# Patient Record
Sex: Female | Born: 1985
Health system: Southern US, Community
[De-identification: ages and names within clinical notes are randomized; demographics above are authoritative.]

## PROBLEM LIST (undated history)

## (undated) ENCOUNTER — Inpatient Hospital Stay (HOSPITAL_COMMUNITY): Payer: Self-pay

## (undated) DIAGNOSIS — O139 Gestational [pregnancy-induced] hypertension without significant proteinuria, unspecified trimester: Secondary | ICD-10-CM

## (undated) DIAGNOSIS — N83209 Unspecified ovarian cyst, unspecified side: Secondary | ICD-10-CM

## (undated) HISTORY — PX: WISDOM TOOTH EXTRACTION: SHX21

## (undated) HISTORY — PX: NO PAST SURGERIES: SHX2092

## (undated) HISTORY — PX: DILATION AND CURETTAGE OF UTERUS: SHX78

---

## 2009-10-31 ENCOUNTER — Emergency Department: Payer: Self-pay | Admitting: Internal Medicine

## 2009-12-23 ENCOUNTER — Encounter: Payer: Self-pay | Admitting: Obstetrics and Gynecology

## 2010-01-10 ENCOUNTER — Encounter: Payer: Self-pay | Admitting: Maternal & Fetal Medicine

## 2010-02-22 ENCOUNTER — Observation Stay: Payer: Self-pay | Admitting: Obstetrics and Gynecology

## 2010-02-23 ENCOUNTER — Ambulatory Visit: Payer: Self-pay | Admitting: Obstetrics and Gynecology

## 2012-06-19 NOTE — L&D Delivery Note (Signed)
  Delivery Note  First Stage: Labor onset: 1635 Augmentation : none Analgesia /Anesthesia intrapartum: water immersion AROM at 1635  Second Stage: Complete dilation at 1930 Onset of pushing at 1930 FHR second stage 130  Delivery of a viable female at 58 by CNM in LOA position        delivery of fetal head between ctx then maternal relaxation without any urge to push        delay in shoulder delivery by a minute due to loss of maternal urge to push        required staff assisted maternal position change from hands-knees to supine with CNM directed pushing for delivery       delivery of shoulder without difficulty  no nuchal cord Cord double clamped after cessation of pulsation, cut by FOB  Baby to Dad for bonding - Mom assisted out of tub to bed Cord blood sample collected   Collection for public cord blood donation done   Third Stage: Placenta delivered shultz intact with 3 VC @ 2002 Placenta disposition: for disposal Uterine tone slightly boggy- massaged to firm - bleeding moderate amount  1st degree laceration identified - old repair site with skin tear only Anesthesia for repair: 1% xylocaine local Repair 4-0 subcuticular Est. Blood Loss (mL): 300  Complications: none  Mom to postpartum.  Baby to Mom for bonding and initial BF.  Newborn: Birth Weight: 9 pounds 12 ounces  Apgar Scores: 7-9 Feeding planned: breast  Marlinda Mike CNM, MSN, FACNM 11/16/2012, 8:41 PM

## 2012-06-24 LAB — OB RESULTS CONSOLE ANTIBODY SCREEN: Antibody Screen: NEGATIVE

## 2012-06-24 LAB — OB RESULTS CONSOLE RUBELLA ANTIBODY, IGM: Rubella: IMMUNE

## 2012-06-24 LAB — OB RESULTS CONSOLE RPR: RPR: NONREACTIVE

## 2012-06-24 LAB — OB RESULTS CONSOLE GC/CHLAMYDIA
Gonorrhea: NEGATIVE
Gonorrhea: NEGATIVE

## 2012-10-17 LAB — OB RESULTS CONSOLE GBS: GBS: POSITIVE

## 2012-11-15 ENCOUNTER — Other Ambulatory Visit: Payer: Self-pay | Admitting: Certified Nurse Midwife

## 2012-11-16 ENCOUNTER — Encounter (HOSPITAL_COMMUNITY): Payer: Self-pay | Admitting: *Deleted

## 2012-11-16 ENCOUNTER — Inpatient Hospital Stay (HOSPITAL_COMMUNITY)
Admission: AD | Admit: 2012-11-16 | Discharge: 2012-11-18 | DRG: 372 | Disposition: A | Discharge status: Home / Self Care | Payer: BC Managed Care – PPO | Source: Ambulatory Visit | Attending: Obstetrics and Gynecology | Admitting: Obstetrics and Gynecology

## 2012-11-16 DIAGNOSIS — O139 Gestational [pregnancy-induced] hypertension without significant proteinuria, unspecified trimester: Secondary | ICD-10-CM | POA: Diagnosis present

## 2012-11-16 DIAGNOSIS — Z22330 Carrier of Group B streptococcus: Secondary | ICD-10-CM

## 2012-11-16 DIAGNOSIS — O480 Post-term pregnancy: Principal | ICD-10-CM | POA: Diagnosis not present

## 2012-11-16 DIAGNOSIS — O99892 Other specified diseases and conditions complicating childbirth: Secondary | ICD-10-CM | POA: Diagnosis present

## 2012-11-16 HISTORY — DX: Gestational (pregnancy-induced) hypertension without significant proteinuria, unspecified trimester: O13.9

## 2012-11-16 LAB — COMPREHENSIVE METABOLIC PANEL
ALT: 12 U/L (ref 0–35)
AST: 16 U/L (ref 0–37)
Albumin: 2.6 g/dL — ABNORMAL LOW (ref 3.5–5.2)
Alkaline Phosphatase: 254 U/L — ABNORMAL HIGH (ref 39–117)
BUN: 7 mg/dL (ref 6–23)
CO2: 21 mEq/L (ref 19–32)
Calcium: 9.6 mg/dL (ref 8.4–10.5)
Chloride: 104 mEq/L (ref 96–112)
Creatinine, Ser: 0.52 mg/dL (ref 0.50–1.10)
GFR calc Af Amer: >90 mL/min (ref >90)
GFR calc non Af Amer: >90 mL/min (ref >90)
Glucose, Bld: 95 mg/dL (ref 70–99)
Potassium: 4.1 mEq/L (ref 3.5–5.1)
Sodium: 137 mEq/L (ref 135–145)
Total Bilirubin: 0.2 mg/dL — ABNORMAL LOW (ref 0.3–1.2)
Total Protein: 6 g/dL (ref 6.0–8.3)

## 2012-11-16 LAB — CBC
HCT: 34 % — ABNORMAL LOW (ref 36.0–46.0)
Hemoglobin: 11.5 g/dL — ABNORMAL LOW (ref 12.0–15.0)
MCH: 30.1 pg (ref 26.0–34.0)
MCHC: 33.8 g/dL (ref 30.0–36.0)
MCV: 89 fL (ref 78.0–100.0)
Platelets: 229 10*3/uL (ref 150–400)
RBC: 3.82 MIL/uL — ABNORMAL LOW (ref 3.87–5.11)
RDW: 15 % (ref 11.5–15.5)
WBC: 9.4 10*3/uL (ref 4.0–10.5)

## 2012-11-16 LAB — RPR: RPR Ser Ql: NONREACTIVE

## 2012-11-16 LAB — URIC ACID: Uric Acid, Serum: 4.4 mg/dL (ref 2.4–7.0)

## 2012-11-16 LAB — LACTATE DEHYDROGENASE: LDH: 168 U/L (ref 94–250)

## 2012-11-16 MED ORDER — OXYCODONE-ACETAMINOPHEN 5-325 MG PO TABS
1.0000 | ORAL_TABLET | ORAL | Status: DC | PRN
  Administered 2012-11-17 – 2012-11-18 (×5): 1 via ORAL
  Filled 2012-11-16 (×5): qty 1

## 2012-11-16 MED ORDER — WITCH HAZEL-GLYCERIN EX PADS: 1.0000 "application " | MEDICATED_PAD | CUTANEOUS | Status: DC | PRN

## 2012-11-16 MED ORDER — OXYTOCIN BOLUS FROM INFUSION: 500.0000 mL | INTRAVENOUS | Status: DC

## 2012-11-16 MED ORDER — SENNOSIDES-DOCUSATE SODIUM 8.6-50 MG PO TABS
2.0000 | ORAL_TABLET | Freq: Every day | ORAL | Status: DC
  Administered 2012-11-16 – 2012-11-17 (×2): 2 via ORAL

## 2012-11-16 MED ORDER — IBUPROFEN 600 MG PO TABS: 600.0000 mg | ORAL_TABLET | Freq: Four times a day (QID) | ORAL | Status: DC | PRN

## 2012-11-16 MED ORDER — ACETAMINOPHEN 325 MG PO TABS: 650.0000 mg | ORAL_TABLET | ORAL | Status: DC | PRN

## 2012-11-16 MED ORDER — OXYTOCIN 40 UNITS IN LACTATED RINGERS INFUSION - SIMPLE MED
62.5000 mL/h | INTRAVENOUS | Status: DC
  Administered 2012-11-16: 62.5 mL/h via INTRAVENOUS
  Filled 2012-11-16: qty 1000

## 2012-11-16 MED ORDER — BENZOCAINE-MENTHOL 20-0.5 % EX AERO
1.0000 "application " | INHALATION_SPRAY | CUTANEOUS | Status: DC | PRN
  Administered 2012-11-17: 1 via TOPICAL
  Filled 2012-11-16: qty 56

## 2012-11-16 MED ORDER — DIPHENHYDRAMINE HCL 25 MG PO CAPS: 25.0000 mg | ORAL_CAPSULE | Freq: Four times a day (QID) | ORAL | Status: DC | PRN

## 2012-11-16 MED ORDER — SODIUM CHLORIDE 0.9 % IJ SOLN: 3.0000 mL | INTRAMUSCULAR | Status: DC | PRN

## 2012-11-16 MED ORDER — SIMETHICONE 80 MG PO CHEW: 80.0000 mg | CHEWABLE_TABLET | ORAL | Status: DC | PRN

## 2012-11-16 MED ORDER — DIBUCAINE 1 % RE OINT: 1.0000 "application " | TOPICAL_OINTMENT | RECTAL | Status: DC | PRN

## 2012-11-16 MED ORDER — LIDOCAINE HCL (PF) 1 % IJ SOLN
30.0000 mL | INTRAMUSCULAR | Status: DC | PRN
  Administered 2012-11-16: 30 mL via SUBCUTANEOUS
  Filled 2012-11-16 (×2): qty 30

## 2012-11-16 MED ORDER — IBUPROFEN 600 MG PO TABS
600.0000 mg | ORAL_TABLET | Freq: Four times a day (QID) | ORAL | Status: DC
  Administered 2012-11-16 – 2012-11-18 (×6): 600 mg via ORAL
  Filled 2012-11-16 (×6): qty 1

## 2012-11-16 MED ORDER — SODIUM CHLORIDE 0.9 % IV SOLN: 250.0000 mL | INTRAVENOUS | Status: DC | PRN

## 2012-11-16 MED ORDER — PENICILLIN G POTASSIUM 5000000 UNITS IJ SOLR
5.0000 10*6.[IU] | Freq: Once | INTRAVENOUS | Status: AC
  Administered 2012-11-16: 5 10*6.[IU] via INTRAVENOUS
  Filled 2012-11-16: qty 5

## 2012-11-16 MED ORDER — LANOLIN HYDROUS EX OINT: TOPICAL_OINTMENT | CUTANEOUS | Status: DC | PRN

## 2012-11-16 MED ORDER — DEXTROSE 5 % IV SOLN
2.5000 10*6.[IU] | INTRAVENOUS | Status: DC
  Filled 2012-11-16 (×3): qty 2.5

## 2012-11-16 MED ORDER — CITRIC ACID-SODIUM CITRATE 334-500 MG/5ML PO SOLN: 30.0000 mL | ORAL | Status: DC | PRN

## 2012-11-16 MED ORDER — LACTATED RINGERS IV SOLN
500.0000 mL | INTRAVENOUS | Status: DC | PRN
  Administered 2012-11-16: 1000 mL via INTRAVENOUS

## 2012-11-16 MED ORDER — SODIUM CHLORIDE 0.9 % IJ SOLN: 3.0000 mL | Freq: Two times a day (BID) | INTRAMUSCULAR | Status: DC

## 2012-11-16 NOTE — Progress Notes (Signed)
S:  Laboring in water x 1 hour      out of tub to void and refresh water  O:  VS: Blood pressure 154/86, pulse 85, temperature 98 F (36.7 C), temperature source Oral, resp. rate 18, height 5\' 8"  (1.727 m), weight 84.823 kg (187 lb).        FHR : intermittent FHR - baseline 150s - no audible decels thru ctx and after        Toco: contractions every 2-4 minutes / strong        Cervix : 9 / 90% / vtx +1 / large amount show        Membranes: clear fluid  A: active labor Water immersion effective for pain control  P: re-enter water for second stage      Monitor FHR every 15 minutes now - then every 5 minutes with active second stage     Marlinda Mike CNM, MSN, [facility] 11/16/2012, 7:18 PM

## 2012-11-16 NOTE — MAU Note (Signed)
IV disconnected for transfer. Site without redness or swelling.

## 2012-11-16 NOTE — Progress Notes (Signed)
S:  Aware of ctx - not painful  O:  VS: Blood pressure 154/86, pulse 85, temperature 98 F (36.7 C), temperature source Oral, resp. rate 18, weight 84.823 kg (187 lb).        FHR : baseline 140 / variability moderate / accelerations + / no decelerations        Toco: contractions every 5-7 minutes / mild with UI between ctx         Cervix : 4-5 / 80% / vtx / -1 / ROT / BBOW        PCN prophylaxis initial dose given in MAU at time of arrival to MAU        Membranes: AROM - clear fluid  A: Induction of labor     FHR category 1  P: ambulate x 1-2 hours / water immersion prn      If no active labor by 1900-  Will start pitocin    Marlinda Mike CNM, MSN, [facility] 11/16/2012, 4:43 PM

## 2012-11-16 NOTE — MAU Note (Signed)
Pt for induction, +GBS, unable to go to L&D at this time.  Irregular ctx's.  Bloody mucous since examined.

## 2012-11-16 NOTE — Plan of Care (Signed)
Problem: Consults Goal: Birthing Suites Patient Information Press F2 to bring up selections list Outcome: Completed/Met Date Met:  11/16/12  Pt > [redacted] weeks EGA

## 2012-11-16 NOTE — H&P (Signed)
  OB ADMISSION/ HISTORY & PHYSICAL:  Admission Date: 11/16/2012 12:20 PM  Admit Diagnosis: postdates / grade 3 placenta  Latasha Lynch is a 27 y.o. female presenting for induction of labor.  Prenatal History: [id number]   EDC : Not found.  Prenatal care at [facility]  Primary Ob Provider: Ernestina Penna Prenatal course complicated by hx preeclampsia  / preterm twin delivery in past pregnancy / gest HTN /positive GBS / postdates pregnancy / grade 3 placenta with (? Complex amniotic fluid?)    Normal fetal testing - reactive NST / normal AFI and BPP / no EFW  (by exam ~ 8 pounds)  Prenatal Labs: ABO, Rh: A (01/06 0000) positive Antibody: Negative (01/06 0000) Rubella: Immune (01/06 0000)  RPR: Nonreactive (01/06 0000)  HBsAg: Negative (01/06 0000)  HIV: Non-reactive (01/06 0000)  GBS: Positive (05/01 0000)  1 hr Glucola : nl  Medical / Surgical History :  Past medical history:  Past Medical History  Diagnosis Date  . Pregnancy induced hypertension   . Preterm labor      Past surgical history:  Past Surgical History  Procedure Laterality Date  . No past surgeries      Family History:  Family History  Problem Relation Age of Onset  . Hypertension Mother   . Cancer Mother     mouth  . Hypertension Father   . Cancer Maternal Grandmother     ovarian  . Cancer Paternal Grandmother     lung  . Cancer Paternal Grandfather     prostate     Social History:  reports that she has never smoked. She has never used smokeless tobacco. She reports that she does not drink alcohol or use illicit drugs.  Allergies: Review of patient's allergies indicates not on file.   Current Medications at time of admission:  Prenatal daily EPO 1000mg  BID ( oral and vaginal) Blue cohosh BID Tums prn  Review of Systems: Irregular ctx x 24 hours Active FM No LOF or bleeding  Physical Exam:  VS: Blood pressure 140/96, pulse 101, temperature 98 F (36.7 C), temperature  source Oral, resp. rate 18.  General: alert and oriented, appears calm and comfortable Heart: RRR Lungs: Clear lung fields Abdomen: Gravid, soft and non-tender, non-distended / uterus: gravid / non-tender / fetal lie: ROT Extremities: 1+ edema  Genitalia / VE:  4 / 80% / vtx / -1 / BBOW  FHR: baseline rate 140 / variability moderate / accelerations + / no decelerations TOCO: mild irregular ctx  Assessment: [redacted] weeks gestation Gestational hypertension Favorable Bishops score  FHR category 1   Plan:  Admit Check PIH labs PCN protocol AROM 2 hours after PCN - expectant management with water immersion in labor if clear AF Pitocin augmentation if no active labor in next 2 hours Dr Billy Coast notified of admission / plan of care   Marlinda Mike CNM, MSN, [facility] 11/16/2012, 1:16 PM

## 2012-11-16 NOTE — MAU Note (Signed)
Called pharmacy to have them send penicillin here

## 2012-11-17 LAB — CBC
HCT: 32.4 % — ABNORMAL LOW (ref 36.0–46.0)
Hemoglobin: 10.7 g/dL — ABNORMAL LOW (ref 12.0–15.0)
MCH: 29.4 pg (ref 26.0–34.0)
MCHC: 33 g/dL (ref 30.0–36.0)
MCV: 89 fL (ref 78.0–100.0)
Platelets: 237 10*3/uL (ref 150–400)
RBC: 3.64 MIL/uL — ABNORMAL LOW (ref 3.87–5.11)
RDW: 14.9 % (ref 11.5–15.5)
WBC: 17.2 10*3/uL — ABNORMAL HIGH (ref 4.0–10.5)

## 2012-11-17 NOTE — Progress Notes (Signed)
PPD 1 SVD  S:  Reports feeling well except "ctx with BF and a little tired" / no PIH symptoms             Tolerating po/ No nausea or vomiting             Bleeding is lighter this AM             Pain controlled with motrin and percocet             Up ad lib / ambulatory / voiding QS  Newborn breast feeding  / Circumcision planned today O:               VS: BP 131/83  Pulse 98  Temp(Src) 98.2 F (36.8 C) (Oral)  Resp 20  Ht 5\' 8"  (1.727 m)  Wt 84.823 kg (187 lb)  BMI 28.44 kg/m2   LABS:  Recent Labs  11/16/12 1354 11/17/12 0615  WBC 9.4 17.2*  HGB 11.5* 10.7*  PLT 229 237                                     Physical Exam:             Alert and oriented X3  Lungs: Clear and unlabored  Heart: regular rate and rhythm / no mumurs  Abdomen: soft, non-tender, non-distended              Fundus: firm, non-tender, U-1  Perineum: mild edema  Lochia: light  Extremities: 1+ edema, no calf pain or tenderness   A: PPD # 1              gestational hypertension - delivered  doing well - stable status  P:  Routine post partum orders  Monitor BP - consider antihypertensive if diastolic over 100  Marlinda Mike CNM, MSN, FACNM 11/17/2012, 9:55 AM

## 2012-11-18 MED ORDER — IBUPROFEN 600 MG PO TABS: 600.0000 mg | ORAL_TABLET | Freq: Four times a day (QID) | ORAL | Status: DC

## 2012-11-18 MED ORDER — OXYCODONE-ACETAMINOPHEN 5-325 MG PO TABS: 1.0000 | ORAL_TABLET | ORAL | Status: DC | PRN

## 2012-11-18 NOTE — Discharge Summary (Signed)
Obstetric Discharge Summary  Reason for Admission: induction of labor Prenatal Procedures: NST and ultrasound Intrapartum Procedures: spontaneous vaginal delivery and GBS prophylaxis Postpartum Procedures: none Complications-Operative and Postpartum: 1st degree perineal laceration Hemoglobin  Date Value Range Status  11/17/2012 10.7* 12.0 - 15.0 g/dL Final     HCT  Date Value Range Status  11/17/2012 32.4* 36.0 - 46.0 % Final    Physical Exam:  General: alert, cooperative and no distress Lochia: appropriate Uterine Fundus: firm Incision: healing well DVT Evaluation: No evidence of DVT seen on physical exam.  Discharge Diagnoses: Term Pregnancy-delivered  Discharge Information: Date: 11/18/2012 Activity: pelvic rest Diet: routine Medications: PNV, Ibuprofen, Colace and Percocet Condition: stable Instructions: refer to practice specific booklet Discharge to: home Follow-up Information   Follow up with [facility] A., MD. Schedule an appointment as soon as possible for a visit in 6 weeks.   Contact information:   [address] [place] [address] [phone]       Newborn Data: Live born female  Birth Weight: 9 lb 12.6 oz (4440 g) APGAR: 7, 9  Home with mother.  Marlinda Mike 11/18/2012, 10:13 AM

## 2012-11-18 NOTE — Progress Notes (Signed)
PPD 2 SVD  S:  Reports feeling well             Tolerating po/ No nausea or vomiting             Bleeding is light             Pain controlled with motrin and percocet             Up ad lib / ambulatory / voiding QS  Newborn breast feeding    O:               VS: BP 137/84  Pulse 81  Temp(Src) 97.9 F (36.6 C) (Oral)  Resp 18  Ht 5\' 8"  (1.727 m)  Wt 84.823 kg (187 lb)  BMI 28.44 kg/m2   LABS:  Recent Labs  11/16/12 1354 11/17/12 0615  WBC 9.4 17.2*  HGB 11.5* 10.7*  PLT 229 237                                  Physical Exam:             Alert and oriented X3  Lungs: Clear and unlabored  Heart: regular rate and rhythm / no mumurs  Abdomen: soft, non-tender, non-distended              Fundus: firm, non-tender, U-1  Perineum: no edema  Lochia: light  Extremities: no edema, no calf pain or tenderness    A: PPD # 2              Gestational hypertension - stable  Doing well - stable status  P:  Routine post partum orders  DC home  Marlinda Mike CNM, MSN, [facility] 11/18/2012, 10:11 AM

## 2014-04-20 ENCOUNTER — Encounter (HOSPITAL_COMMUNITY): Payer: Self-pay | Admitting: *Deleted

## 2015-11-28 ENCOUNTER — Emergency Department: Payer: BLUE CROSS/BLUE SHIELD

## 2015-11-28 ENCOUNTER — Emergency Department
Admission: EM | Admit: 2015-11-28 | Discharge: 2015-11-28 | Disposition: A | Discharge status: Home / Self Care | Payer: BLUE CROSS/BLUE SHIELD | Attending: Emergency Medicine | Admitting: Emergency Medicine

## 2015-11-28 ENCOUNTER — Encounter: Payer: Self-pay | Admitting: Emergency Medicine

## 2015-11-28 DIAGNOSIS — N83201 Unspecified ovarian cyst, right side: Secondary | ICD-10-CM

## 2015-11-28 DIAGNOSIS — R1031 Right lower quadrant pain: Secondary | ICD-10-CM | POA: Diagnosis present

## 2015-11-28 LAB — COMPREHENSIVE METABOLIC PANEL
ALBUMIN: 5.2 g/dL — AB (ref 3.5–5.0)
ALK PHOS: 54 U/L (ref 38–126)
ALT: 15 U/L (ref 14–54)
AST: 18 U/L (ref 15–41)
Anion gap: 10 (ref 5–15)
BILIRUBIN TOTAL: 1.4 mg/dL — AB (ref 0.3–1.2)
BUN: 16 mg/dL (ref 6–20)
CO2: 19 mmol/L — ABNORMAL LOW (ref 22–32)
CREATININE: 0.83 mg/dL (ref 0.44–1.00)
Calcium: 9.6 mg/dL (ref 8.9–10.3)
Chloride: 108 mmol/L (ref 101–111)
GFR calc Af Amer: >60 mL/min (ref >60)
GLUCOSE: 103 mg/dL — AB (ref 65–99)
Potassium: 3.7 mmol/L (ref 3.5–5.1)
Sodium: 137 mmol/L (ref 135–145)
TOTAL PROTEIN: 7.8 g/dL (ref 6.5–8.1)

## 2015-11-28 LAB — URINALYSIS COMPLETE WITH MICROSCOPIC (ARMC ONLY)
BILIRUBIN URINE: NEGATIVE
GLUCOSE, UA: NEGATIVE mg/dL
HGB URINE DIPSTICK: NEGATIVE
LEUKOCYTES UA: NEGATIVE
NITRITE: NEGATIVE
Protein, ur: 100 mg/dL — AB
SPECIFIC GRAVITY, URINE: 1.029 (ref 1.005–1.030)
pH: 5 (ref 5.0–8.0)

## 2015-11-28 LAB — PREGNANCY, URINE: PREG TEST UR: NEGATIVE

## 2015-11-28 LAB — WET PREP, GENITAL
Clue Cells Wet Prep HPF POC: NONE SEEN
Sperm: NONE SEEN
TRICH WET PREP: NONE SEEN
YEAST WET PREP: NONE SEEN

## 2015-11-28 LAB — CHLAMYDIA/NGC RT PCR (ARMC ONLY)
Chlamydia Tr: NOT DETECTED
N GONORRHOEAE: NOT DETECTED

## 2015-11-28 LAB — CBC
HEMATOCRIT: 41.2 % (ref 35.0–47.0)
Hemoglobin: 14.2 g/dL (ref 12.0–16.0)
MCH: 31 pg (ref 26.0–34.0)
MCHC: 34.4 g/dL (ref 32.0–36.0)
MCV: 90.1 fL (ref 80.0–100.0)
PLATELETS: 219 10*3/uL (ref 150–440)
RBC: 4.58 MIL/uL (ref 3.80–5.20)
RDW: 12.7 % (ref 11.5–14.5)
WBC: 24 10*3/uL — AB (ref 3.6–11.0)

## 2015-11-28 LAB — LIPASE, BLOOD: Lipase: 30 U/L (ref 11–51)

## 2015-11-28 MED ORDER — IOPAMIDOL (ISOVUE-300) INJECTION 61%
100.0000 mL | Freq: Once | INTRAVENOUS | Status: AC | PRN
  Administered 2015-11-28: 100 mL via INTRAVENOUS

## 2015-11-28 MED ORDER — SODIUM CHLORIDE 0.9 % IV BOLUS (SEPSIS)
1000.0000 mL | Freq: Once | INTRAVENOUS | Status: AC
  Administered 2015-11-28: 1000 mL via INTRAVENOUS

## 2015-11-28 MED ORDER — HYDROCODONE-ACETAMINOPHEN 5-325 MG PO TABS: 1.0000 | ORAL_TABLET | ORAL | Status: DC | PRN

## 2015-11-28 MED ORDER — DIATRIZOATE MEGLUMINE & SODIUM 66-10 % PO SOLN
15.0000 mL | Freq: Once | ORAL | Status: AC
  Administered 2015-11-28: 15 mL via ORAL

## 2015-11-28 MED ORDER — IBUPROFEN 600 MG PO TABS: 600.0000 mg | ORAL_TABLET | Freq: Three times a day (TID) | ORAL | Status: DC | PRN

## 2015-11-28 MED ORDER — ONDANSETRON HCL 4 MG PO TABS: 4.0000 mg | ORAL_TABLET | Freq: Three times a day (TID) | ORAL | Status: DC | PRN

## 2015-11-28 MED ORDER — IBUPROFEN 600 MG PO TABS
600.0000 mg | ORAL_TABLET | Freq: Once | ORAL | Status: AC
  Administered 2015-11-28: 600 mg via ORAL
  Filled 2015-11-28: qty 1

## 2015-11-28 NOTE — ED Notes (Signed)
Patient presents to the ED with right lower quadrant pain that woke patient up from her sleep around 4am.  Patient states for a couple of hours her pain was so severe she could not move.  Patient reports severe tenderness to right lower quadrant with pain radiating across her abdomen.  Patient ambulatory to triage with slight wincing.  Patient states she had similar pain a few weeks ago but it only lasted for about 1 hour.  Patient reports nausea but denies vomiting and diarrhea.

## 2015-11-28 NOTE — ED Notes (Signed)
Patient reports pain is worse with movement. Pain started around 5am this morning.

## 2015-11-28 NOTE — Discharge Instructions (Signed)
You were evaluated for abdominal pain, and found to have ruptured ovarian cyst on the right side.  Exam and evaluation are reassuring in the emergency department today. Treat pain with ibuprofen for anti-inflammatory and mild to moderate pain, or Norco for severe pain.  Return to emergency room for any worsening condition including worsening or uncontrolled pain, fever, vomiting blood, black or bloody stools, dizziness or passing out, or any other symptoms concerning to you.  Ovarian Cyst An ovarian cyst is a fluid-filled sac that forms on an ovary. The ovaries are small organs that produce eggs in women. Various types of cysts can form on the ovaries. Most are not cancerous. Many do not cause problems, and they often go away on their own. Some may cause symptoms and require treatment. Common types of ovarian cysts include:  Functional cysts--These cysts may occur every month during the menstrual cycle. This is normal. The cysts usually go away with the next menstrual cycle if the woman does not get pregnant. Usually, there are no symptoms with a functional cyst.  Endometrioma cysts--These cysts form from the tissue that lines the uterus. They are also called "chocolate cysts" because they become filled with blood that turns brown. This type of cyst can cause pain in the lower abdomen during intercourse and with your menstrual period.  Cystadenoma cysts--This type develops from the cells on the outside of the ovary. These cysts can get very big and cause lower abdomen pain and pain with intercourse. This type of cyst can twist on itself, cut off its blood supply, and cause severe pain. It can also easily rupture and cause a lot of pain.  Dermoid cysts--This type of cyst is sometimes found in both ovaries. These cysts may contain different kinds of body tissue, such as skin, teeth, hair, or cartilage. They usually do not cause symptoms unless they get very big.  Theca lutein cysts--These cysts occur  when too much of a certain hormone (human chorionic gonadotropin) is produced and overstimulates the ovaries to produce an egg. This is most common after procedures used to assist with the conception of a baby (in vitro fertilization). CAUSES   Fertility drugs can cause a condition in which multiple large cysts are formed on the ovaries. This is called ovarian hyperstimulation syndrome.  A condition called polycystic ovary syndrome can cause hormonal imbalances that can lead to nonfunctional ovarian cysts. SIGNS AND SYMPTOMS  Many ovarian cysts do not cause symptoms. If symptoms are present, they may include:  Pelvic pain or pressure.  Pain in the lower abdomen.  Pain during sexual intercourse.  Increasing girth (swelling) of the abdomen.  Abnormal menstrual periods.  Increasing pain with menstrual periods.  Stopping having menstrual periods without being pregnant. DIAGNOSIS  These cysts are commonly found during a routine or annual pelvic exam. Tests may be ordered to find out more about the cyst. These tests may include:  Ultrasound.  X-ray of the pelvis.  CT scan.  MRI.  Blood tests. TREATMENT  Many ovarian cysts go away on their own without treatment. Your health care provider may want to check your cyst regularly for 2-3 months to see if it changes. For women in menopause, it is particularly important to monitor a cyst closely because of the higher rate of ovarian cancer in menopausal women. When treatment is needed, it may include any of the following:  A procedure to drain the cyst (aspiration). This may be done using a long needle and ultrasound. It can also  be done through a laparoscopic procedure. This involves using a thin, lighted tube with a tiny camera on the end (laparoscope) inserted through a small incision.  Surgery to remove the whole cyst. This may be done using laparoscopic surgery or an open surgery involving a larger incision in the lower  abdomen.  Hormone treatment or birth control pills. These methods are sometimes used to help dissolve a cyst. HOME CARE INSTRUCTIONS   Only take over-the-counter or prescription medicines as directed by your health care provider.  Follow up with your health care provider as directed.  Get regular pelvic exams and Pap tests. SEEK MEDICAL CARE IF:   Your periods are late, irregular, or painful, or they stop.  Your pelvic pain or abdominal pain does not go away.  Your abdomen becomes larger or swollen.  You have pressure on your bladder or trouble emptying your bladder completely.  You have pain during sexual intercourse.  You have feelings of fullness, pressure, or discomfort in your stomach.  You lose weight for no apparent reason.  You feel generally ill.  You become constipated.  You lose your appetite.  You develop acne.  You have an increase in body and facial hair.  You are gaining weight, without changing your exercise and eating habits.  You think you are pregnant. SEEK IMMEDIATE MEDICAL CARE IF:   You have increasing abdominal pain.  You feel sick to your stomach (nauseous), and you throw up (vomit).  You develop a fever that comes on suddenly.  You have abdominal pain during a bowel movement.  Your menstrual periods become heavier than usual. MAKE SURE YOU:  Understand these instructions.  Will watch your condition.  Will get help right away if you are not doing well or get worse.   This information is not intended to replace advice given to you by your health care provider. Make sure you discuss any questions you have with your health care provider.   Document Released: 06/05/2005 Document Revised: 06/10/2013 Document Reviewed: 02/10/2013 Elsevier Interactive Patient Education [employer].

## 2015-11-28 NOTE — ED Notes (Signed)
Patient transported to CT 

## 2015-11-28 NOTE — ED Notes (Signed)
Patient transported to Ultrasound 

## 2015-11-28 NOTE — ED Provider Notes (Addendum)
[facility] Emergency Department Provider Note   ____________________________________________  Time seen: Approximately 11:45am I have reviewed the triage vital signs and the triage nursing note.  HISTORY  Chief Complaint Abdominal Pain   Historian Patient, family members  HPI Latasha Lynch is a 30 y.o. female who is here for evaluation of abdominal pain. Abdominal pain started this morning around 4 AM and was moderate to severe. She tried sitting up and off for a few hours and it was still bad and so she decided to come to the ED for further evaluation. No vomiting or diarrhea. No fever. Pain is located mid and right side and lower abdomen. No vaginal complaints.  Abdominal pain has eased off somewhat and is now rating 3 out of 10.  Last had some symptoms water around 9 AM. No solid food today.   Past Medical History  Diagnosis Date  . Pregnancy induced hypertension   . Preterm labor     Patient Active Problem List   Diagnosis Date Noted  . SVD (spontaneous vaginal delivery) 11/17/2012  . Postpartum care following vaginal delivery (5/31) 11/17/2012  . Post-dates pregnancy 11/16/2012  . Gestational hypertension 11/16/2012    Past Surgical History  Procedure Laterality Date  . No past surgeries      Current Outpatient Rx  Name  Route  Sig  Dispense  Refill  . HYDROcodone-acetaminophen (NORCO/VICODIN) 5-325 MG tablet   Oral   Take 1-2 tablets by mouth every 4 (four) hours as needed for severe pain.   10 tablet   0   . ibuprofen (ADVIL,MOTRIN) 600 MG tablet   Oral   Take 1 tablet (600 mg total) by mouth every 8 (eight) hours as needed.   20 tablet   0   . oxyCODONE-acetaminophen (PERCOCET/ROXICET) 5-325 MG per tablet   Oral   Take 1 tablet by mouth every 4 (four) hours as needed.   20 tablet   0     Allergies Review of patient's allergies indicates no known allergies.  Family History  Problem Relation Age of Onset  .  Hypertension Mother   . Cancer Mother     mouth  . Hypertension Father   . Cancer Maternal Grandmother     ovarian  . Cancer Paternal Grandmother     lung  . Cancer Paternal Grandfather     prostate    Social History Social History  Substance Use Topics  . Smoking status: Never Smoker   . Smokeless tobacco: Never Used  . Alcohol Use: No    Review of Systems  Constitutional: Negative for fever. Eyes: Negative for visual changes. ENT: Negative for sore throat. Cardiovascular: Negative for chest pain. Respiratory: Negative for shortness of breath. Gastrointestinal: Negative for vomiting and diarrhea. Genitourinary: Negative for dysuria. Musculoskeletal: Negative for back pain. Skin: Negative for rash. Neurological: Negative for headache. 10 point Review of Systems otherwise negative ____________________________________________   PHYSICAL EXAM:  VITAL SIGNS: ED Triage Vitals  Enc Vitals Group     BP 11/28/15 1042 128/83 mmHg     Pulse Rate 11/28/15 1042 109     Resp 11/28/15 1042 18     Temp 11/28/15 1042 98.7 F (37.1 C)     Temp Source 11/28/15 1042 Oral     SpO2 11/28/15 1042 97 %     Weight 11/28/15 1042 135 lb (61.236 kg)     Height 11/28/15 1042 5\' 8"  (1.727 m)     Head Cir --  Peak Flow --      Pain Score 11/28/15 1043 4     Pain Loc --      Pain Edu? --      Excl. in [place]? --      Constitutional: Alert and oriented. Well appearing and in no distess. HEENT   Head: Normocephalic and atraumatic.      Eyes: Conjunctivae are normal. PERRL. Normal extraocular movements.      Ears:         Nose: No congestion/rhinnorhea.   Mouth/Throat: Mucous membranes are moist.   Neck: No stridor. Cardiovascular/Chest: Normal rate, regular rhythm.  No murmurs, rubs, or gallops. Respiratory: Normal respiratory effort without tachypnea nor retractions. Breath sounds are clear and equal bilaterally. No wheezes/rales/rhonchi. Gastrointestinal: Soft. No  distention.  Moderate tenderness right lower quadrant with some guarding diffusely into the upper and left quadrants as well.  Genitourinary/rectal:Deferred Musculoskeletal: Nontender with normal range of motion in all extremities. No joint effusions.  No lower extremity tenderness.  No edema. Neurologic:  Normal speech and language. No gross or focal neurologic deficits are appreciated. Skin:  Skin is warm, dry and intact. No rash noted. Psychiatric: Mood and affect are normal. Speech and behavior are normal. Patient exhibits appropriate insight and judgment.  ____________________________________________   EKG I, Lisa Roca, MD, the attending physician have personally viewed and interpreted all ECGs.  None ____________________________________________  LABS (pertinent positives/negatives)  Labs Reviewed  WET PREP, GENITAL - Abnormal; Notable for the following:    WBC, Wet Prep HPF POC FEW (*)    All other components within normal limits  COMPREHENSIVE METABOLIC PANEL - Abnormal; Notable for the following:    CO2 19 (*)    Glucose, Bld 103 (*)    Albumin 5.2 (*)    Total Bilirubin 1.4 (*)    All other components within normal limits  CBC - Abnormal; Notable for the following:    WBC 24.0 (*)    All other components within normal limits  URINALYSIS COMPLETEWITH MICROSCOPIC ([facility] ONLY) - Abnormal; Notable for the following:    Color, Urine AMBER (*)    APPearance HAZY (*)    Ketones, ur 1+ (*)    Protein, ur 100 (*)    Bacteria, UA RARE (*)    Squamous Epithelial / LPF 6-30 (*)    All other components within normal limits  CHLAMYDIA/NGC RT PCR ([facility] ONLY)  URINE CULTURE  LIPASE, BLOOD  PREGNANCY, URINE    ____________________________________________  RADIOLOGY All Xrays were viewed by me. Imaging interpreted by Radiologist.  CT abdomen and pelvis with contrast:  IMPRESSION: 1. Simple 2.8 cm right ovarian follicular cyst with surrounding fluid which may  represent recent cyst rupture. This likely represents the source of the patient's right lower quadrant pain. 2. IUD in place. 3. Otherwise, unremarkable CT scan of the abdomen and pelvis including the appendix which is normal.   Ultrasound transvaginal and pelvic with Doppler:   IMPRESSION: 1. Dominant follicle in the right ovary, requiring no additional follow-up. 2. The solid rounded region in the left ovary is likely a corpus luteum cyst. Recommend 6 week follow-up to ensure resolution. 3. Fluid in the pelvis is likely physiologic. __________________________________________  PROCEDURES  Procedure(s) performed: None  Critical Care performed: None  ____________________________________________   ED COURSE / ASSESSMENT AND PLAN  Pertinent labs & imaging results that were available during my care of the patient were reviewed by me and considered in my medical decision making (see chart for  details).   Abdominal pain, worst in the right lower quadrant without significant GI or urinary symptoms with elderly white blood cell count, concerning for appendicitis. I discussed with versus benefit in obtaining CT and we chose to proceed.  CT scan shows no appendicitis, but some free fluid possibly related to right-sided ovarian cyst.  I discussed with the patient obtaining ultrasound for further evaluation.  Transvaginal pelvic ultrasound with Doppler negative for any sign of torsion, there is a right sided cyst and some free pelvic fluid. Radiology commented physiologic fluid, her symptoms certainly sound like they may have been related to ruptured ovarian cyst.  I will discharge her with ibuprofen as well as Norco. I'm recommending follow-up with OB/GYN.   CONSULTATIONS:   None   Patient / Family / Caregiver informed of clinical course, medical decision-making process, and agree with plan.   I discussed return precautions, follow-up instructions, and discharged instructions  with patient and/or family.  Addended to include that after discussion with her she was questioning whether or not to go ahead and start antibiotics. I do not have a source, and I discussed with her that all so we can consider placing her on an antibiotic to cover potential GYN sources, she does not have a fever, her imaging is reassuring with respect to signs of infection, and her vaginal swabs are also negative. I think there is some downside to initiating antibiotics including allergic reaction, nausea, diarrhea, disruption of normal gut flora.  I discussed with her may be let send blood cultures just based on the high white blood cell count as an additional layer investigation, but again very low suspicion.  I did ask her to return to the ED at any point time if she does develop a fever because this might change the risk-benefit ratio for starting antibiotics without a source.  I discussed with her radiologist recommends repeat ultrasound in 6-8 weeks for the left-sided likely corpus luteum. ___________________________________________   FINAL CLINICAL IMPRESSION(S) / ED DIAGNOSES   Final diagnoses:  Cyst of right ovary              Note: This dictation was prepared with Dragon dictation. Any transcriptional errors that result from this process are unintentional   Lisa Roca, MD 11/28/15 [place], MD 11/28/15 [id number]

## 2015-11-30 LAB — URINE CULTURE

## 2015-12-03 LAB — CULTURE, BLOOD (ROUTINE X 2)
CULTURE: NO GROWTH
Culture: NO GROWTH

## 2016-11-29 IMAGING — CT CT ABD-PELV W/ CM
2 of 4 series · 16 of 46 positions shown, 18 images · IV contrast (iopamidol)
Comparison: None.

CLINICAL DATA: 29-year-old female with right lower quadrant pain

EXAM:
CT ABDOMEN AND PELVIS WITH CONTRAST
TECHNIQUE: Multidetector CT imaging of the abdomen and pelvis was performed
using the standard protocol following bolus administration of
intravenous contrast.
CONTRAST:  100mL 7GKIXM-NNN IOPAMIDOL (7GKIXM-NNN) INJECTION 61%

[Series 2: routine abd pel with · axial · 0.61mm/px · z∈[-1054,-674]mm · 13 of 84 slices shown, 15 images]
[im 4/84  soft-tissue]
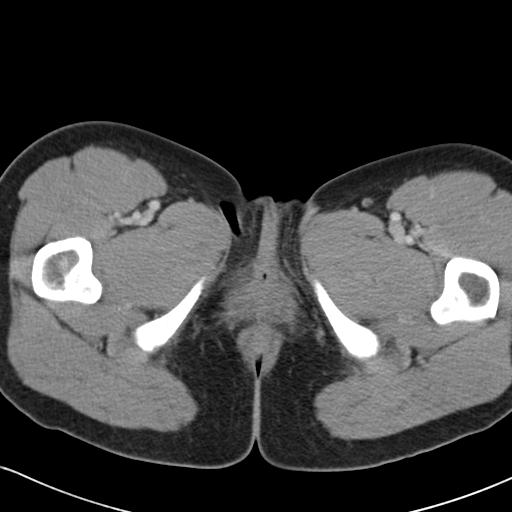
[im 4/84  bone]
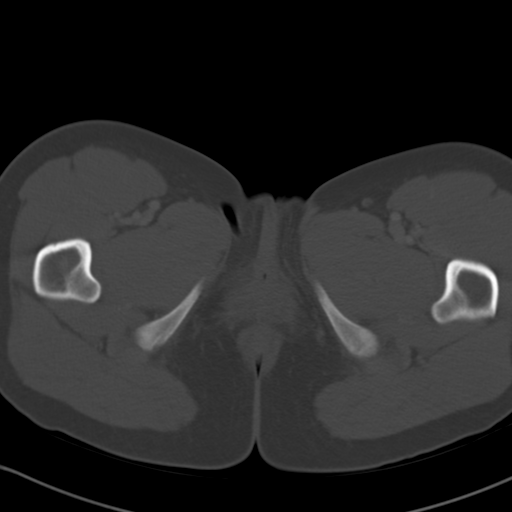
[im 11/84  soft-tissue]
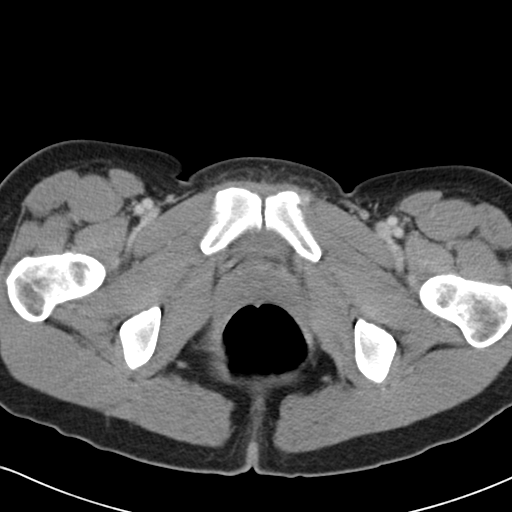
[im 18/84  soft-tissue]
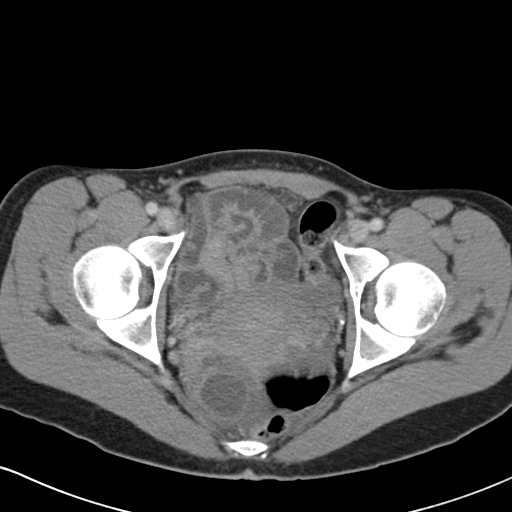
[im 25/84  soft-tissue]
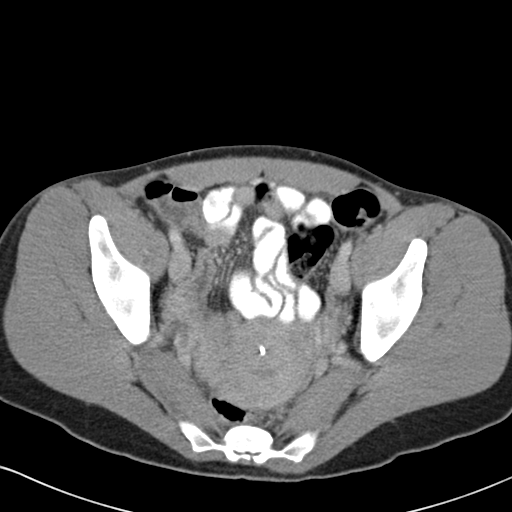
[im 28/84  soft-tissue]
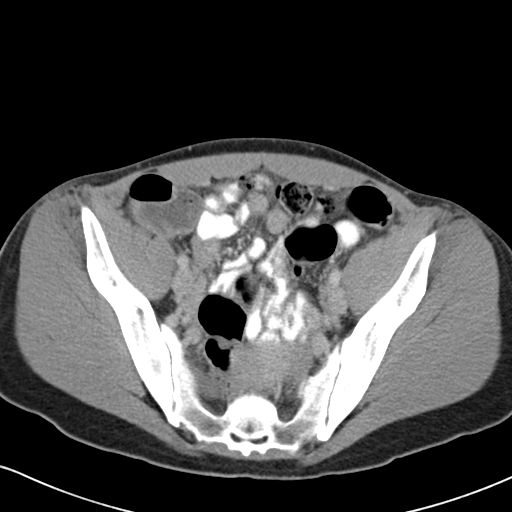
[im 35/84  soft-tissue]
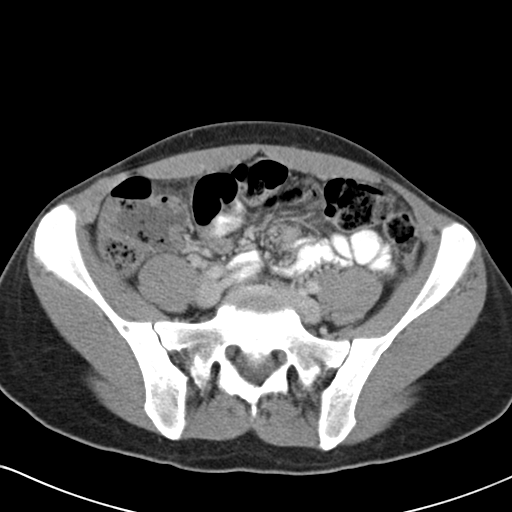
[im 42/84  soft-tissue]
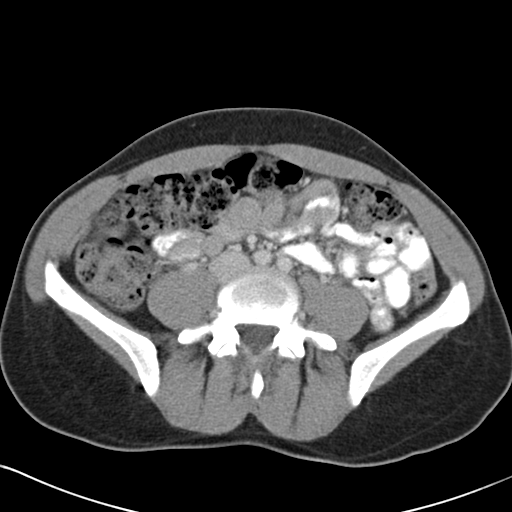
[im 49/84  soft-tissue]
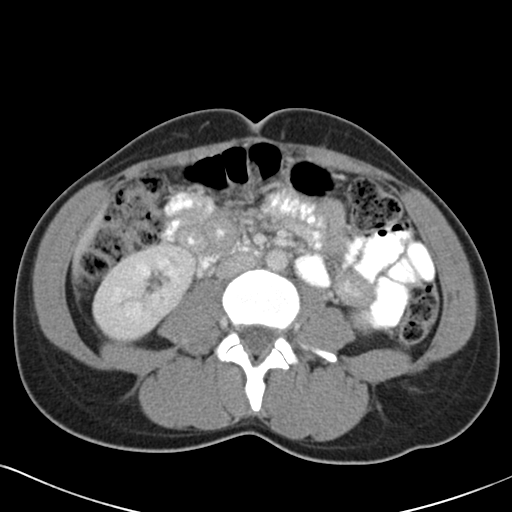
[im 56/84  soft-tissue]
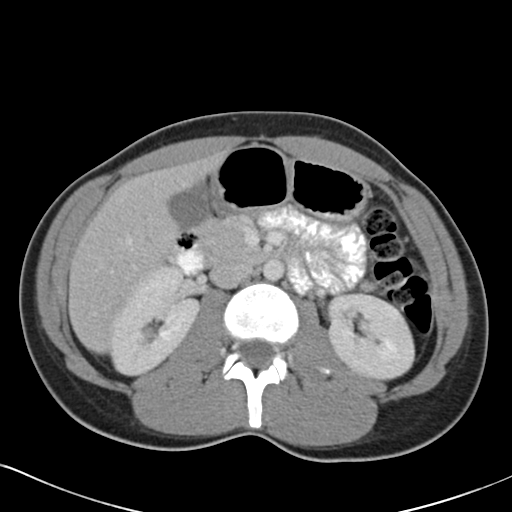
[im 56/84  bone]
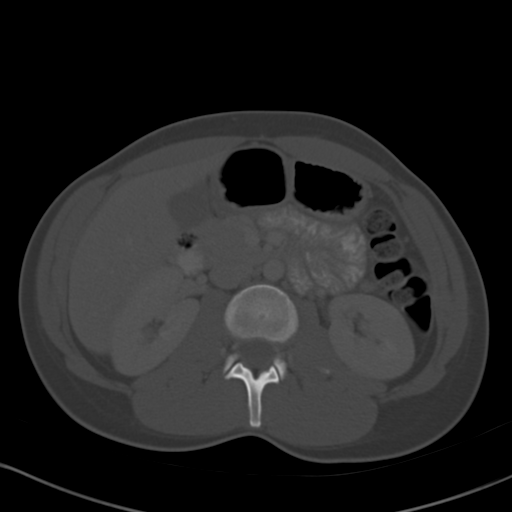
[im 59/84  soft-tissue]
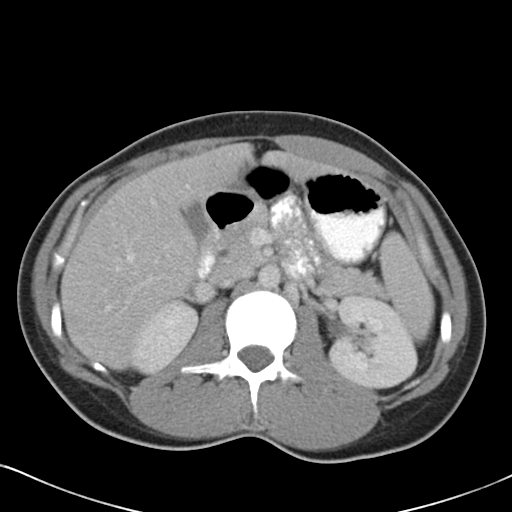
[im 66/84  soft-tissue]
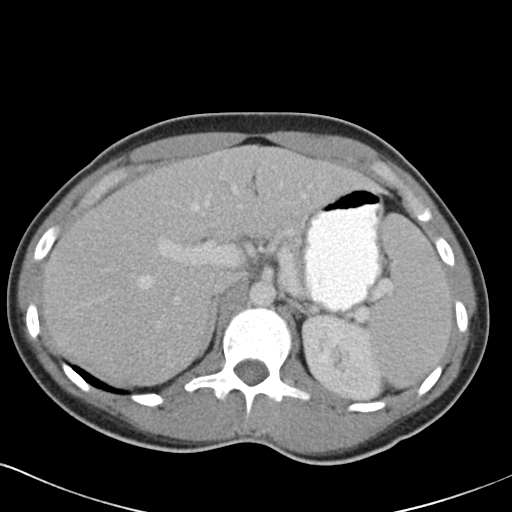
[im 73/84  soft-tissue]
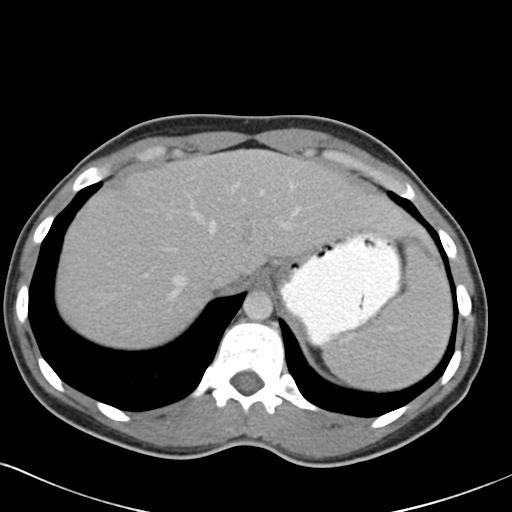
[im 80/84  soft-tissue]
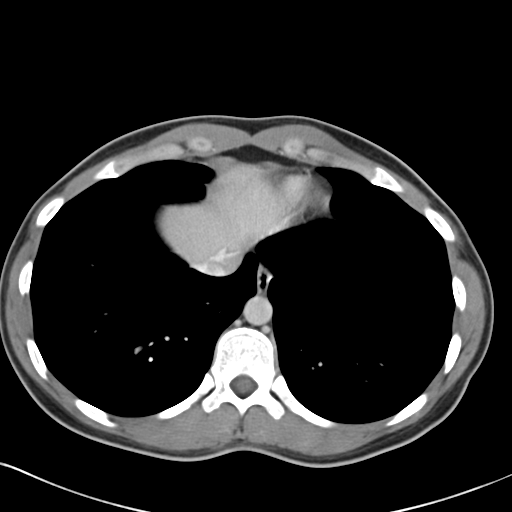

[Series 5: cor routine abd pel with · coronal · 0.62mm/px · 3 of 101 slices shown]
[im 34/101  soft-tissue]
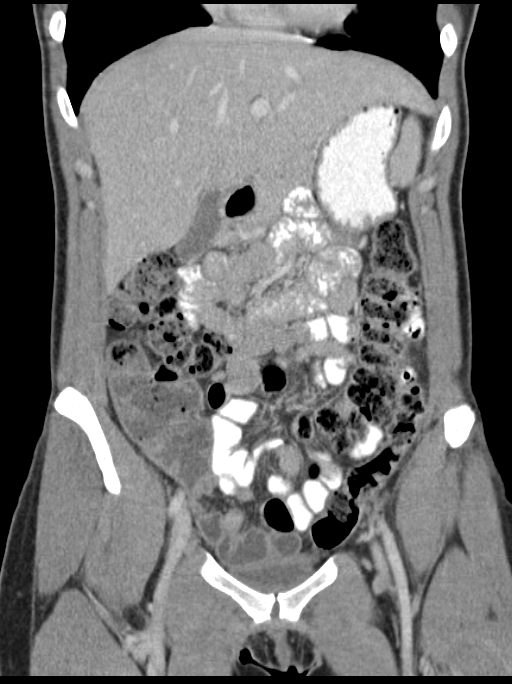
[im 45/101  soft-tissue]
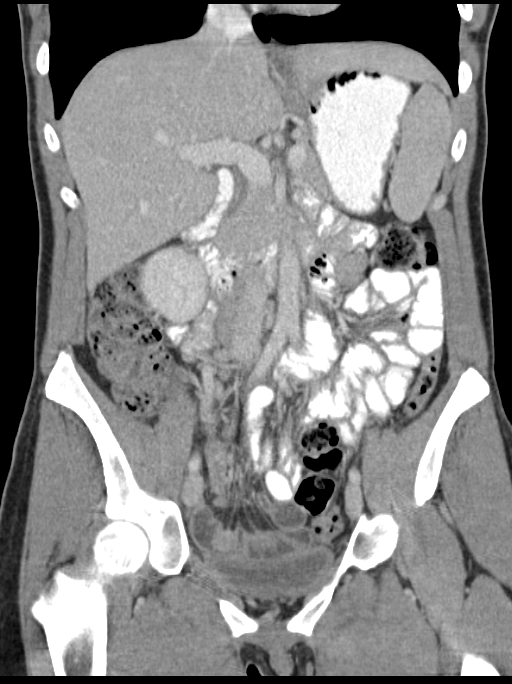
[im 56/101  soft-tissue]
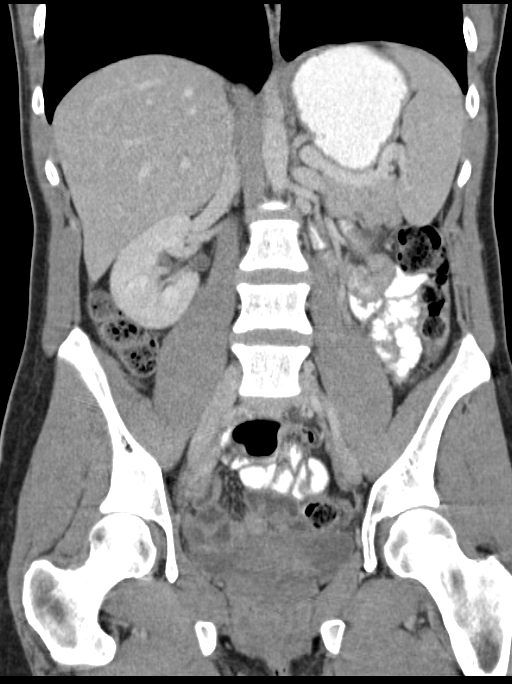

[16 of 46 positions shown; findings below may reference images not displayed]

FINDINGS: Lower chest:  No acute findings.

Hepatobiliary: No masses or other significant abnormality.
Gallbladder is unremarkable. No intra or extrahepatic biliary ductal
dilatation.

Pancreas: No mass, inflammatory changes, or other significant
abnormality.

Spleen: Within normal limits in size and appearance.

Adrenals/Urinary Tract: No masses identified. No evidence of
hydronephrosis.

Stomach/Bowel: No evidence of obstruction or focal bowel wall
thickening. Normal appendix in the right lower quadrant. The
terminal ileum is unremarkable.

Vascular/Lymphatic: No pathologically enlarged lymph nodes. No
evidence of abdominal aortic aneurysm.

Reproductive: 2.8 cm sonographically simple cyst in the deep right
anatomic pelvis likely affiliated with the right ovary. There is
surrounding free fluid. An IUD is present within the uterus.

Other: None.

Musculoskeletal:  No suspicious bone lesions identified.
IMPRESSION: 1. Simple 2.8 cm right ovarian follicular cyst with surrounding
fluid which may represent recent cyst rupture. This likely
represents the source of the patient's right lower quadrant pain.
2. IUD in place.
3. Otherwise, unremarkable CT scan of the abdomen and pelvis
including the appendix which is normal.

## 2017-09-05 IMAGING — US US PELVIS COMPLETE
1 series · 13 of 25 positions shown · non-contrast
Comparison: CT scan from earlier today

CLINICAL DATA: Right lower quadrant pain for 8 hours



[Series 1: us pelvis complete · 0.24mm/px · 13 of 108 slices shown]
[im 1/108]
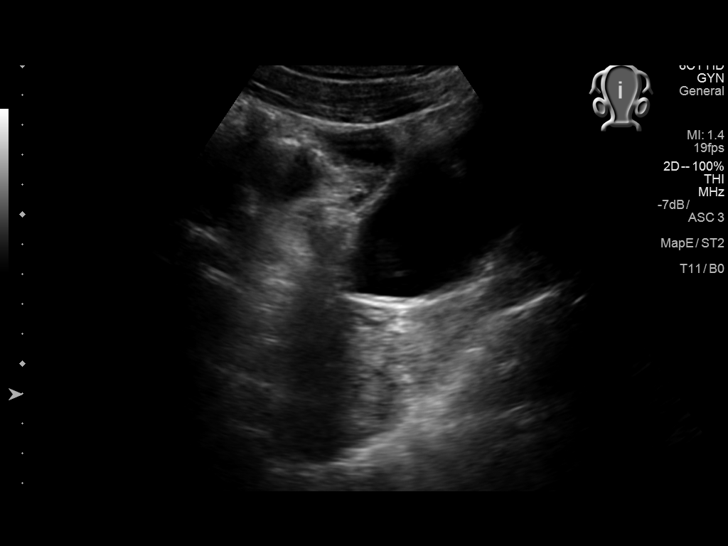
[im 9/108]
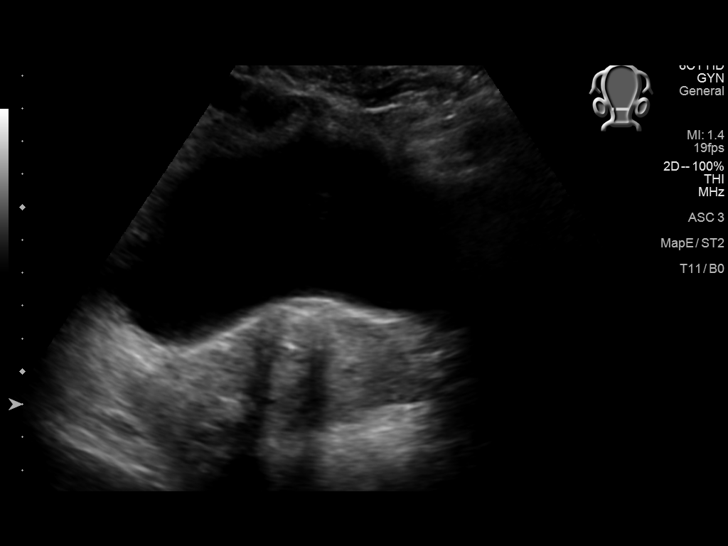
[im 18/108]
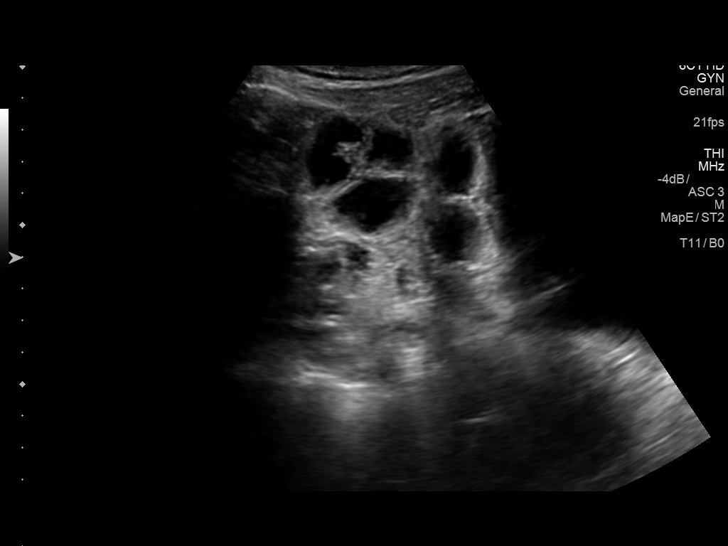
[im 27/108]
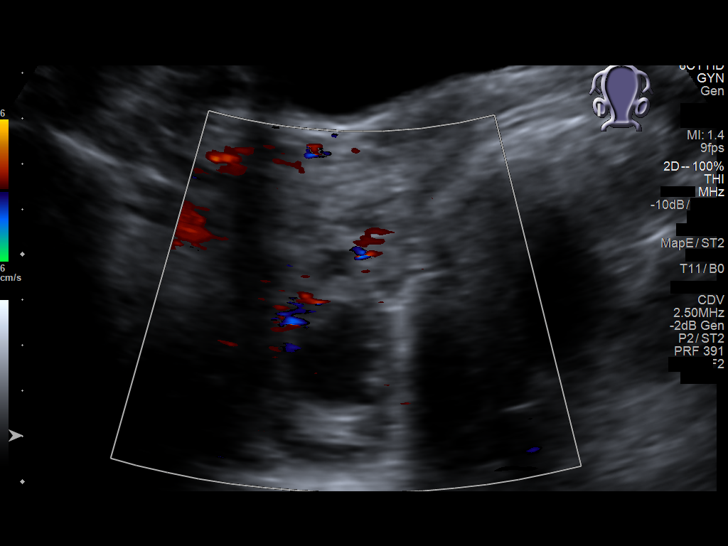
[im 36/108]
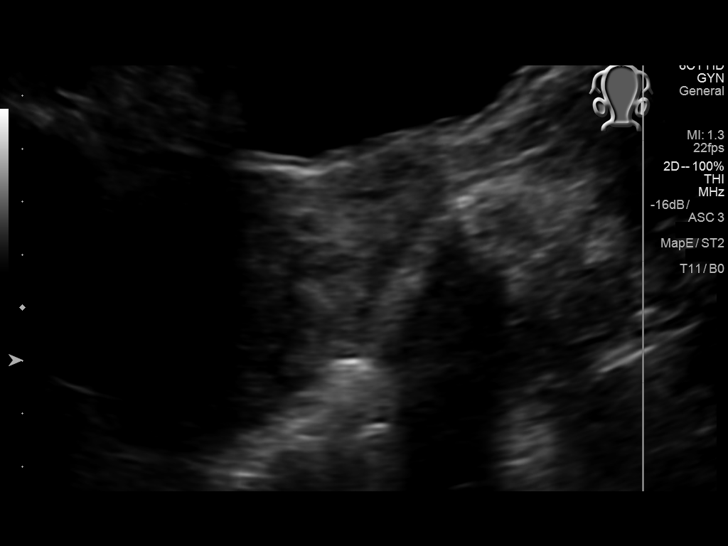
[im 45/108]
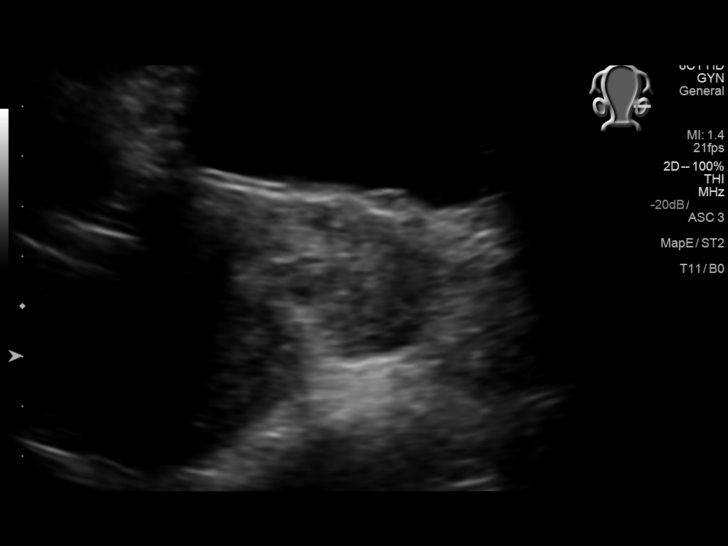
[im 54/108]
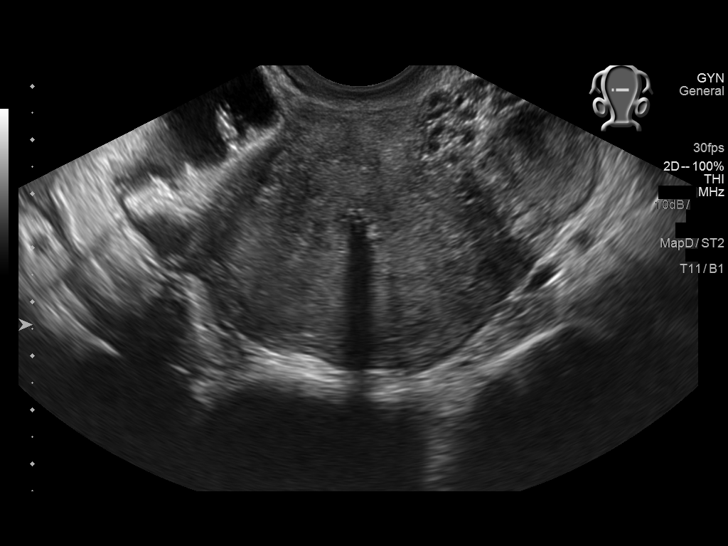
[im 63/108]
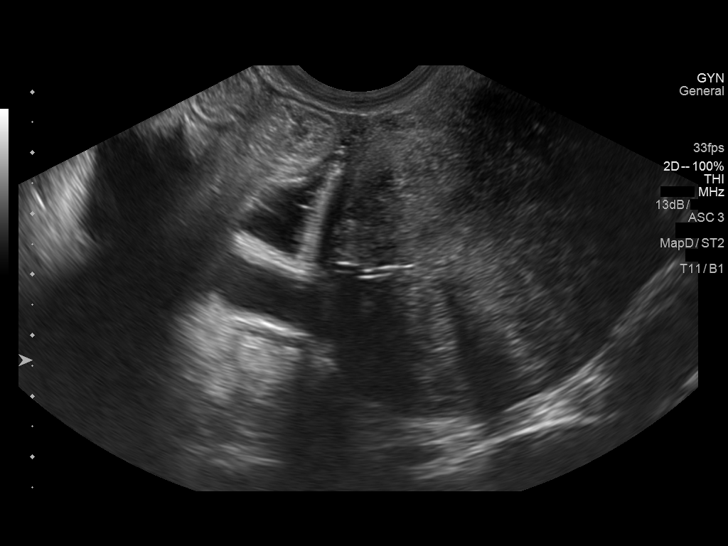
[im 72/108]
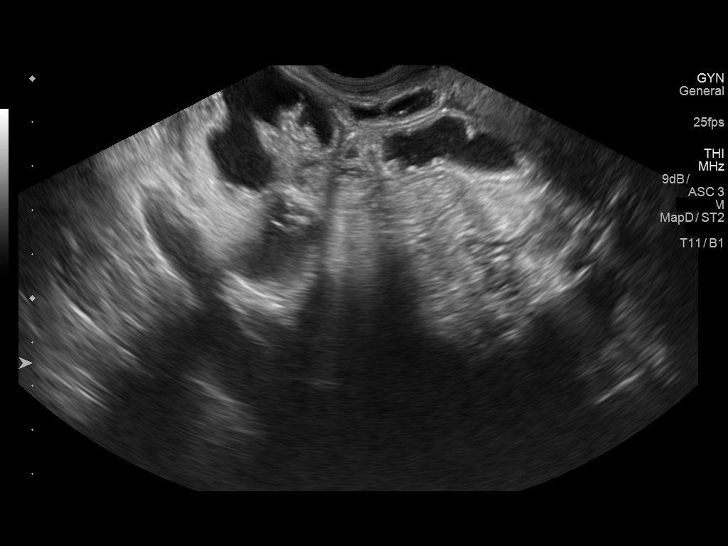
[im 81/108]
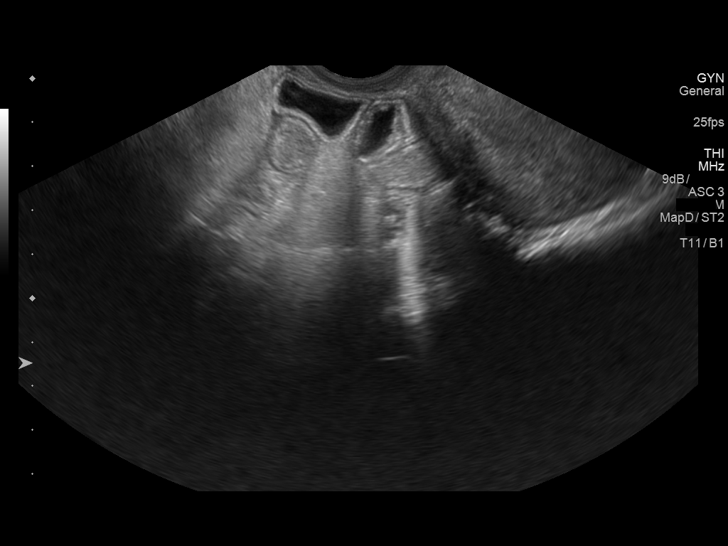
[im 90/108]
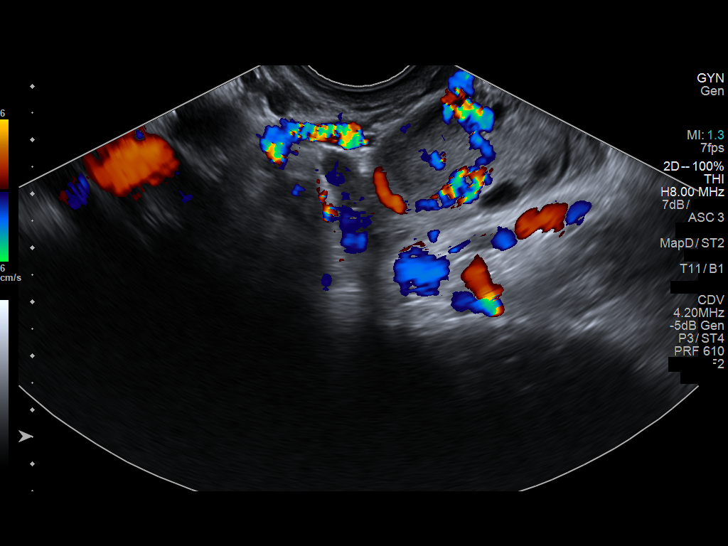
[im 99/108]
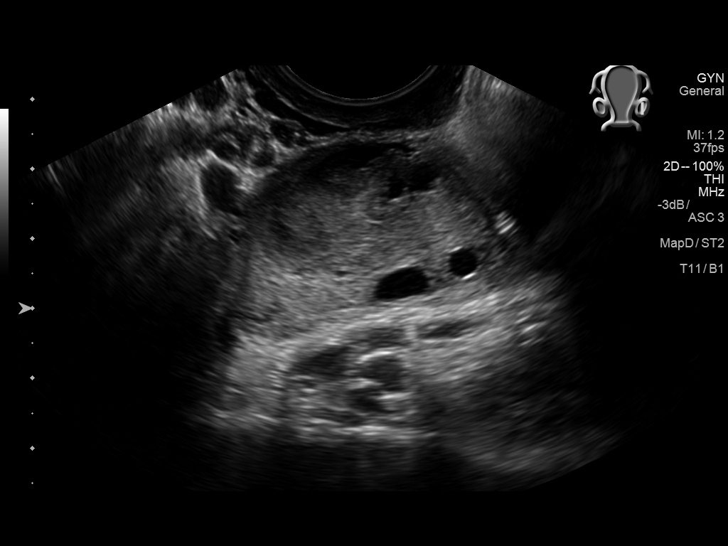
[im 108/108]
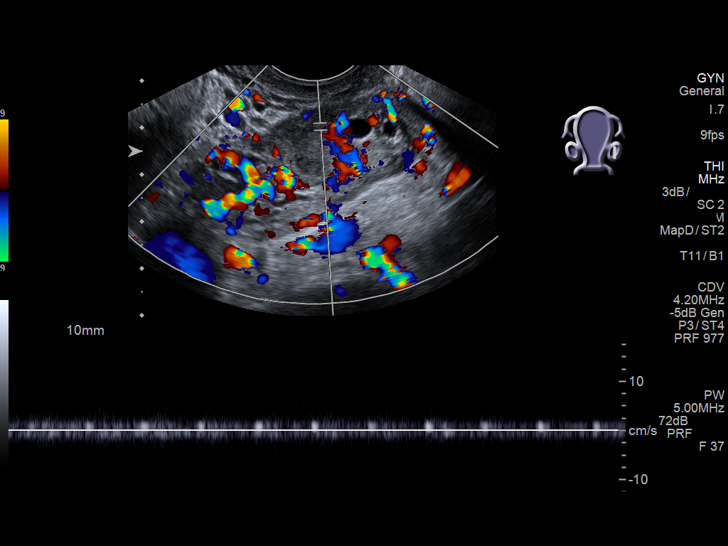

[13 of 25 positions shown; findings below may reference images not displayed]

FINDINGS: Uterus

Measurements: 7.8 x 5.4 x 6.3 cm.. No suspicious masses. The
sonographer stated that the IUD approached the edge of the left
myometrium. On the CT from earlier today, this does not appear to be
the case and the IUD is well seated in the uterus. The closest
margin of the external uterus is 8 mm from the right portion of the
IUD.

Endometrium

Thickness: 9.4 mm.  No focal abnormality visualized.

Right ovary

Measurements: 7.1 x 4.1 x 3.9 cm. The right ovary was only
visualized transabdominally due to bowel loops in the pelvis on
transabdominal imaging. There appears to be a dominant 2.3 x 2.0 x
3.4 cm follicle in the right ovary.

Left ovary

Measurements: 4.4 x 2.4 x 2.3 cm. There is a rounded hypoechoic
solid region in the left ovary with peripheral blood flow measuring
2.0 x 1.5 x 1.6 cm, favored to represent a corpus luteum cyst.

Pulsed Doppler evaluation of both ovaries demonstrates normal
low-resistance arterial and venous waveforms.

Other findings

A small amount of free fluid is seen in the pelvis.
IMPRESSION: 1. Dominant follicle in the right ovary, requiring no additional
follow-up.
2. The solid rounded region in the left ovary is likely a corpus
luteum cyst. Recommend 6 week follow-up to ensure resolution.
3. Fluid in the pelvis is likely physiologic.

## 2018-03-15 ENCOUNTER — Other Ambulatory Visit: Payer: Self-pay | Admitting: Obstetrics and Gynecology

## 2018-03-15 ENCOUNTER — Other Ambulatory Visit: Payer: Self-pay

## 2018-03-15 ENCOUNTER — Inpatient Hospital Stay (HOSPITAL_COMMUNITY)
Admission: AD | Admit: 2018-03-15 | Discharge: 2018-03-15 | Disposition: A | Discharge status: Home / Self Care | Payer: BLUE CROSS/BLUE SHIELD | Source: Ambulatory Visit | Attending: Obstetrics and Gynecology | Admitting: Obstetrics and Gynecology

## 2018-03-15 ENCOUNTER — Inpatient Hospital Stay (HOSPITAL_COMMUNITY): Payer: BLUE CROSS/BLUE SHIELD

## 2018-03-15 ENCOUNTER — Encounter (HOSPITAL_COMMUNITY): Payer: Self-pay | Admitting: *Deleted

## 2018-03-15 DIAGNOSIS — O209 Hemorrhage in early pregnancy, unspecified: Secondary | ICD-10-CM | POA: Insufficient documentation

## 2018-03-15 DIAGNOSIS — O26899 Other specified pregnancy related conditions, unspecified trimester: Secondary | ICD-10-CM

## 2018-03-15 DIAGNOSIS — O26891 Other specified pregnancy related conditions, first trimester: Secondary | ICD-10-CM

## 2018-03-15 DIAGNOSIS — Z3A01 Less than 8 weeks gestation of pregnancy: Secondary | ICD-10-CM | POA: Diagnosis not present

## 2018-03-15 DIAGNOSIS — Z79899 Other long term (current) drug therapy: Secondary | ICD-10-CM | POA: Insufficient documentation

## 2018-03-15 DIAGNOSIS — Z3201 Encounter for pregnancy test, result positive: Secondary | ICD-10-CM

## 2018-03-15 DIAGNOSIS — R1031 Right lower quadrant pain: Secondary | ICD-10-CM

## 2018-03-15 DIAGNOSIS — R102 Pelvic and perineal pain: Secondary | ICD-10-CM | POA: Diagnosis not present

## 2018-03-15 LAB — HCG, QUANTITATIVE, PREGNANCY: hCG, Beta Chain, Quant, S: 291 m[IU]/mL — ABNORMAL HIGH (ref <5)

## 2018-03-15 LAB — URINALYSIS, ROUTINE W REFLEX MICROSCOPIC
Bilirubin Urine: NEGATIVE
GLUCOSE, UA: NEGATIVE mg/dL
Hgb urine dipstick: NEGATIVE
KETONES UR: NEGATIVE mg/dL
Leukocytes, UA: NEGATIVE
NITRITE: NEGATIVE
PROTEIN: NEGATIVE mg/dL
Specific Gravity, Urine: 1.026 (ref 1.005–1.030)
pH: 5 (ref 5.0–8.0)

## 2018-03-15 LAB — POCT PREGNANCY, URINE: Preg Test, Ur: POSITIVE — AB

## 2018-03-15 NOTE — Discharge Instructions (Signed)
Ectopic Pregnancy An ectopic pregnancy happens when a fertilized egg grows outside the uterus. A pregnancy cannot live outside of the uterus. This problem often happens in the fallopian tube. It is often caused by damage to the fallopian tube. If this problem is found early, you may be treated with medicine. If your tube tears or bursts open (ruptures), you will bleed inside. This is an emergency. You will need surgery. Get help right away. What are the signs or symptoms? You may have normal pregnancy symptoms at first. These include:  Missing your period.  Feeling sick to your stomach (nauseous).  Being tired.  Having tender breasts.  Then, you may start to have symptoms that are not normal. These include:  Pain with sex (intercourse).  Bleeding from the vagina. This includes light bleeding (spotting).  Belly (abdomen) or lower belly cramping or pain. This may be felt on one side.  A fast heartbeat (pulse).  Passing out (fainting) after going poop (bowel movement).  If your tube tears, you may have symptoms such as:  Really bad pain in the belly or lower belly. This happens suddenly.  Dizziness.  Passing out.  Shoulder pain.  Get help right away if: You have any of these symptoms. This is an emergency. This information is not intended to replace advice given to you by your health care provider. Make sure you discuss any questions you have with your health care provider. Document Released: 09/01/2008 Document Revised: 11/11/2015 Document Reviewed: 01/15/2013 Elsevier Interactive Patient Education  2017 [employer]. Abdominal Pain During Pregnancy Abdominal pain is common in pregnancy. Most of the time, it does not cause harm. There are many causes of abdominal pain. Some causes are more serious than others and sometimes the cause is not known. Abdominal pain can be a sign that something is very wrong with the pregnancy or the pain may have nothing to do with the pregnancy.  Always tell your health care provider if you have any abdominal pain. Follow these instructions at home:  Do not have sex or put anything in your vagina until your symptoms go away completely.  Watch your abdominal pain for any changes.  Get plenty of rest until your pain improves.  Drink enough fluid to keep your urine clear or pale yellow.  Take over-the-counter or prescription medicines only as told by your health care provider.  Keep all follow-up visits as told by your health care provider. This is important. Contact a health care provider if:  You have a fever.  Your pain gets worse or you have cramping.  Your pain continues after resting. Get help right away if:  You are bleeding, leaking fluid, or passing tissue from the vagina.  You have vomiting or diarrhea that does not go away.  You have painful or bloody urination.  You notice a decrease in your baby's movements.  You feel very weak or faint.  You have shortness of breath.  You develop a severe headache with abdominal pain.  You have abnormal vaginal discharge with abdominal pain. This information is not intended to replace advice given to you by your health care provider. Make sure you discuss any questions you have with your health care provider. Document Released: 06/05/2005 Document Revised: 03/16/2016 Document Reviewed: 01/02/2013 Elsevier Interactive Patient Education  [employer].

## 2018-03-15 NOTE — MAU Note (Signed)
Pt presents to MAU c/o right sided abdominal pain. Pt states she took tylenol at 0200 with only a little relief. The pt reports the pain is a constant shooting pressure that is a 8/10. No bleeding, LOF or vaginal discharge

## 2018-03-15 NOTE — MAU Provider Note (Addendum)
History     Chief Complaint  Patient presents with  . Abdominal Pain   32 yo [id number] MWF LMP 8/20 with hx irreg cycle (+) upt presents with c/o RLQ pain since 5:30 PM yesterday. Pain is sharp constant nonradiating. No assoc n/v.  Pt had IUD removal 11/2017. Was TTC. BM this am nl  OB History    Gravida  3   Para  2   Term  1   Preterm  1   AB  0   Living  3     SAB  0   TAB  0   Ectopic  0   Multiple  0   Live Births  1           Past Medical History:  Diagnosis Date  . Pregnancy induced hypertension   . Preterm labor     Past Surgical History:  Procedure Laterality Date  . NO PAST SURGERIES      Family History  Problem Relation Age of Onset  . Hypertension Mother   . Cancer Mother        mouth  . Hypertension Father   . Cancer Maternal Grandmother        ovarian  . Cancer Paternal Grandmother        lung  . Cancer Paternal Grandfather        prostate    Social History   Tobacco Use  . Smoking status: Never Smoker  . Smokeless tobacco: Never Used  Substance Use Topics  . Alcohol use: No  . Drug use: No    Allergies: No Known Allergies  Medications Prior to Admission  Medication Sig Dispense Refill Last Dose  . acetaminophen (TYLENOL) 500 MG tablet Take 500 mg by mouth every 6 (six) hours as needed.   03/15/2018 at Unknown time  . FOLIC ACID PO Take by mouth.   Past Week at Unknown time  . Prenatal Vit-Fe Fumarate-FA (PRENATAL MULTIVITAMIN) TABS tablet Take 1 tablet by mouth daily at 12 noon.   Past Week at Unknown time  . HYDROcodone-acetaminophen (NORCO/VICODIN) 5-325 MG tablet Take 1-2 tablets by mouth every 4 (four) hours as needed for severe pain. 15 tablet 0   . ibuprofen (ADVIL,MOTRIN) 600 MG tablet Take 1 tablet (600 mg total) by mouth every 8 (eight) hours as needed. 20 tablet 0   . ondansetron (ZOFRAN) 4 MG tablet Take 1 tablet (4 mg total) by mouth every 8 (eight) hours as needed for nausea or vomiting. 10 tablet 0   .  oxyCODONE-acetaminophen (PERCOCET/ROXICET) 5-325 MG per tablet Take 1 tablet by mouth every 4 (four) hours as needed. 20 tablet 0      Physical Exam   Blood pressure 122/68, pulse 80, temperature 98.5 F (36.9 C), temperature source Oral, resp. rate 18, height 5\' 8"  (1.727 m), weight 64.4 kg, last menstrual period 02/05/2018.  General appearance: alert, cooperative and no distress Lungs: clear to auscultation bilaterally Heart: regular rate and rhythm, S1, S2 normal, no murmur, click, rub or gallop Abdomen: soft non distended (+) BS. tender RLQ no rebound Pelvic: external genitalia normal and cervix nl. parous, scant white d/c w/o odor. uters sl enlarged RV, adnexa left nontender right vaginal /low tender no palp mass Extremities: no edema, redness or tenderness in the calves or thighs ED Course  RLQ pain P) Hquant. Pelvic sonogram MDM  hquant: 291 Ob/abd sonogram( check appendix0  Marvene Staff, MD 7:55 AM 03/15/2018  Addendum US Ob Less Than 14  Weeks With Ob Transvaginal  Result Date: 03/15/2018 CLINICAL DATA:  Pelvic pain affecting pregnancy. RIGHT LOWER QUADRANT pain. Quantitative beta HCG is 291. LMP 02/05/2018. By LMP patient is 5 weeks 3 days with EDC of 11/12/2018. EXAM: OBSTETRIC <14 WK [place] AND TRANSVAGINAL OB US TECHNIQUE: Both transabdominal and transvaginal ultrasound examinations were performed for complete evaluation of the gestation as well as the maternal uterus, adnexal regions, and pelvic cul-de-sac. Transvaginal technique was performed to assess early pregnancy. COMPARISON:  None. FINDINGS: Intrauterine gestational sac: Single Yolk sac:  Not Visualized. Embryo:  Not Visualized. Cardiac Activity: Not Visualized. Heart Rate: Absent bpm MSD: 4.78 mm   5 w   1 d Subchorionic hemorrhage:  Nonvisualized. Maternal uterus/adnexae: A circumscribed oval mass with homogeneous internal echoes is identified in the LEFT ovary, measuring 4.0 x 3.0 x 2.2 centimeters. This likely  represents a hemorrhagic corpus luteum cyst. No free pelvic fluid. RIGHT ovary is normal in appearance. IMPRESSION: 1. Intrauterine gestational sac is present. Embryo is not yet visualized. Follow-up ultrasound is recommended in 14 or more days to document presence of fetal pole and for dating purposes. 2. Hemorrhagic LEFT corpus luteum cyst. Electronically Signed   By: Nolon Nations M.D.   On: 03/15/2018 09:48   F/u hquant on Sunday here and Tuesday in office Ectopic precautions Cont tylenol prn

## 2018-03-17 ENCOUNTER — Other Ambulatory Visit (HOSPITAL_COMMUNITY)
Admission: RE | Admit: 2018-03-17 | Discharge: 2018-03-17 | Disposition: A | Discharge status: Home / Self Care | Payer: BLUE CROSS/BLUE SHIELD | Source: Ambulatory Visit | Attending: Obstetrics and Gynecology | Admitting: Obstetrics and Gynecology

## 2018-03-17 ENCOUNTER — Other Ambulatory Visit (HOSPITAL_COMMUNITY): Payer: Self-pay | Admitting: Obstetrics & Gynecology

## 2018-03-17 DIAGNOSIS — R102 Pelvic and perineal pain: Secondary | ICD-10-CM | POA: Insufficient documentation

## 2018-03-17 DIAGNOSIS — O26891 Other specified pregnancy related conditions, first trimester: Secondary | ICD-10-CM

## 2018-03-17 LAB — HCG, QUANTITATIVE, PREGNANCY: HCG, BETA CHAIN, QUANT, S: 357 m[IU]/mL — AB (ref <5)

## 2018-03-22 ENCOUNTER — Other Ambulatory Visit: Payer: Self-pay | Admitting: Obstetrics

## 2018-03-23 ENCOUNTER — Other Ambulatory Visit
Admission: RE | Admit: 2018-03-23 | Discharge: 2018-03-23 | Disposition: A | Discharge status: Home / Self Care | Payer: BLUE CROSS/BLUE SHIELD | Source: Ambulatory Visit | Attending: Obstetrics | Admitting: Obstetrics

## 2018-03-23 DIAGNOSIS — O0281 Inappropriate change in quantitative human chorionic gonadotropin (hCG) in early pregnancy: Secondary | ICD-10-CM | POA: Insufficient documentation

## 2018-03-23 LAB — HCG, QUANTITATIVE, PREGNANCY: HCG, BETA CHAIN, QUANT, S: 283 m[IU]/mL — AB (ref <5)

## 2018-03-25 ENCOUNTER — Other Ambulatory Visit
Admission: RE | Admit: 2018-03-25 | Discharge: 2018-03-25 | Disposition: A | Discharge status: Home / Self Care | Payer: BLUE CROSS/BLUE SHIELD | Source: Ambulatory Visit | Attending: Obstetrics | Admitting: Obstetrics

## 2018-03-25 ENCOUNTER — Other Ambulatory Visit: Payer: Self-pay | Admitting: Obstetrics

## 2018-03-25 DIAGNOSIS — O0281 Inappropriate change in quantitative human chorionic gonadotropin (hCG) in early pregnancy: Secondary | ICD-10-CM | POA: Insufficient documentation

## 2018-03-25 LAB — HCG, QUANTITATIVE, PREGNANCY: HCG, BETA CHAIN, QUANT, S: 102 m[IU]/mL — AB (ref <5)

## 2018-03-28 ENCOUNTER — Other Ambulatory Visit
Admission: RE | Admit: 2018-03-28 | Discharge: 2018-03-28 | Disposition: A | Discharge status: Home / Self Care | Payer: BLUE CROSS/BLUE SHIELD | Source: Ambulatory Visit | Attending: Obstetrics | Admitting: Obstetrics

## 2018-03-28 DIAGNOSIS — O0281 Inappropriate change in quantitative human chorionic gonadotropin (hCG) in early pregnancy: Secondary | ICD-10-CM | POA: Diagnosis present

## 2018-03-28 LAB — HCG, QUANTITATIVE, PREGNANCY: HCG, BETA CHAIN, QUANT, S: 29 m[IU]/mL — AB (ref <5)

## 2018-03-29 ENCOUNTER — Other Ambulatory Visit: Payer: Self-pay | Admitting: Obstetrics

## 2019-02-04 ENCOUNTER — Encounter (HOSPITAL_COMMUNITY): Payer: Self-pay

## 2019-05-02 ENCOUNTER — Other Ambulatory Visit (HOSPITAL_COMMUNITY): Payer: Self-pay | Admitting: Obstetrics

## 2019-05-02 DIAGNOSIS — Z3682 Encounter for antenatal screening for nuchal translucency: Secondary | ICD-10-CM

## 2019-05-02 DIAGNOSIS — Z3A13 13 weeks gestation of pregnancy: Secondary | ICD-10-CM

## 2019-06-10 ENCOUNTER — Encounter (HOSPITAL_COMMUNITY): Payer: Self-pay | Admitting: *Deleted

## 2019-06-16 ENCOUNTER — Encounter (HOSPITAL_COMMUNITY): Payer: Self-pay

## 2019-06-16 ENCOUNTER — Ambulatory Visit (HOSPITAL_COMMUNITY): Payer: PRIVATE HEALTH INSURANCE | Admitting: *Deleted

## 2019-06-16 ENCOUNTER — Ambulatory Visit (HOSPITAL_COMMUNITY): Payer: PRIVATE HEALTH INSURANCE

## 2019-06-16 ENCOUNTER — Other Ambulatory Visit: Payer: Self-pay

## 2019-06-16 ENCOUNTER — Ambulatory Visit (HOSPITAL_COMMUNITY)
Admission: RE | Admit: 2019-06-16 | Discharge: 2019-06-16 | Disposition: A | Discharge status: Home / Self Care | Payer: PRIVATE HEALTH INSURANCE | Source: Ambulatory Visit | Attending: Obstetrics and Gynecology | Admitting: Obstetrics and Gynecology

## 2019-06-16 VITALS — BP 126/84 | HR 67 | Temp 98.2°F

## 2019-06-16 DIAGNOSIS — Z36 Encounter for antenatal screening for chromosomal anomalies: Secondary | ICD-10-CM

## 2019-06-16 DIAGNOSIS — O09211 Supervision of pregnancy with history of pre-term labor, first trimester: Secondary | ICD-10-CM | POA: Diagnosis not present

## 2019-06-16 DIAGNOSIS — Z3A13 13 weeks gestation of pregnancy: Secondary | ICD-10-CM | POA: Diagnosis not present

## 2019-06-16 DIAGNOSIS — O09291 Supervision of pregnancy with other poor reproductive or obstetric history, first trimester: Secondary | ICD-10-CM | POA: Diagnosis not present

## 2019-06-16 DIAGNOSIS — Z3682 Encounter for antenatal screening for nuchal translucency: Secondary | ICD-10-CM | POA: Insufficient documentation

## 2019-06-16 DIAGNOSIS — Z1379 Encounter for other screening for genetic and chromosomal anomalies: Secondary | ICD-10-CM

## 2019-06-16 HISTORY — DX: Unspecified ovarian cyst, unspecified side: N83.209

## 2019-06-18 ENCOUNTER — Telehealth (HOSPITAL_COMMUNITY): Payer: Self-pay | Admitting: Genetic Counselor

## 2019-06-18 LAB — FIRST TRIMESTER SCREEN W/NT
CRL: 75 mm
DIA MoM: 0.7
DIA Value: 139.4 pg/mL
Gest Age-Collect: 13.3 weeks
Maternal Age At EDD: 34.9 yr
Nuchal Translucency MoM: 0.62
Nuchal Translucency: 1.3 mm
Number of Fetuses: 1
PAPP-A MoM: 1.79
PAPP-A Value: 2063.2 ng/mL
Test Results:: NEGATIVE
Weight: 158 [lb_av]
hCG MoM: 0.47
hCG Value: 37.2 IU/mL

## 2019-06-18 NOTE — Telephone Encounter (Signed)
I called Ms. Panning to discuss her negative first trimester screen results. We reviewed that the risk for her pregnancy to be affected by Down syndrome decreased from her 1 in 286 age-related risk to less than 1 in 10,000, and the risk for trisomy 18 decreased from her 1 in 44 age-related risk to less than 1 in 10,000 based on the results of this screen. Ms. Hoe was reminded that while this screen significantly reduces the likelihood of the pregnancy being affected by trisomy 90 or trisomy 41, it cannot be considered diagnostic. Diagnostic testing via [facility] or amniocentesis is available should she be interested in pursuing this. First trimester screening does not screen for open neural tube defects such as spina bifida, so it is recommended that MS-AFP screening be ordered around 16-18 weeks to screen for this. Ms. Dimock confirmed that she had no questions about these results.  Buelah Manis, MS Genetic Counselor

## 2019-06-20 NOTE — L&D Delivery Note (Signed)
Delivery Note:   [id number] at [redacted]w[redacted]d  Admitting diagnosis: Encounter for induction of labor [Z34.90] Gestational hypertension [O13.9] Risks: hx LGA, gest HTN Onset of labor: IOL started w/ buccal cytotec x 3 doses, onset of physiologic labor at 1300. Augmentation: Cytotec ROM: SROM clear AF 1500  Complete dilation at   1525 Onset of pushing at 1525 FHR second stage Cat 1  Analgesia /Anesthesia intrapartum:Local  Pushing in H/K position in labor tub with CNM and L&D staff support, [name] (FOB) present for birth and supportive.  Delivery of a Live born female  Birth Weight:  3561 g Weight:, English: 7 lb 13.6 oz APGAR: 7, 9  Newborn Delivery   Birth date/time: 12/05/2019 15:27:00 Delivery type: Vaginal, Spontaneous      in cephalic presentation, position OA to LOT. Nuchal Cord: Yes x 1, relieved after delivery Cord double clamped after cessation of pulsation, cut by [name].  Collection of cord blood for typing completed. Cord blood donation-None  Arterial cord blood sample-No    Placenta delivered-Spontaneous  with 3 vessels . Uterotonics: Pitocin IM Placenta to L&D for disposal. Uterine tone firm after LUS swept for clots and massage, bleeding small thereafter.  1st degree  laceration identified.  Episiotomy:None  Local analgesia: 1% lido 8 cc  Repair: 3.0 vicryl in standard fashion Est. Blood Loss (DH):741.63   Complications: None   Mom to postpartum.  Baby "[place]" to Couplet care / Skin to Skin.  Delivery Report:  Review the Delivery Report for details.     Signed: Juliene Pina, CNM, MSN 12/05/2019, 4:28 PM

## 2019-06-23 ENCOUNTER — Encounter (HOSPITAL_COMMUNITY): Payer: Self-pay | Admitting: Obstetrics

## 2019-06-23 ENCOUNTER — Other Ambulatory Visit (HOSPITAL_COMMUNITY): Payer: Self-pay | Admitting: Obstetrics

## 2019-12-03 ENCOUNTER — Encounter (HOSPITAL_COMMUNITY): Payer: Self-pay | Admitting: *Deleted

## 2019-12-03 ENCOUNTER — Telehealth (HOSPITAL_COMMUNITY): Payer: Self-pay | Admitting: *Deleted

## 2019-12-03 ENCOUNTER — Other Ambulatory Visit: Payer: Self-pay

## 2019-12-03 NOTE — Telephone Encounter (Signed)
Preadmission screen  

## 2019-12-04 ENCOUNTER — Other Ambulatory Visit (HOSPITAL_COMMUNITY)
Admission: RE | Admit: 2019-12-04 | Discharge: 2019-12-04 | Disposition: A | Discharge status: Home / Self Care | Payer: 59 | Source: Ambulatory Visit | Attending: Obstetrics and Gynecology | Admitting: Obstetrics and Gynecology

## 2019-12-04 DIAGNOSIS — Z01812 Encounter for preprocedural laboratory examination: Secondary | ICD-10-CM | POA: Insufficient documentation

## 2019-12-04 DIAGNOSIS — Z20822 Contact with and (suspected) exposure to covid-19: Secondary | ICD-10-CM | POA: Insufficient documentation

## 2019-12-04 LAB — SARS CORONAVIRUS 2 (TAT 6-24 HRS): SARS Coronavirus 2: NEGATIVE

## 2019-12-05 ENCOUNTER — Other Ambulatory Visit: Payer: Self-pay

## 2019-12-05 ENCOUNTER — Inpatient Hospital Stay (HOSPITAL_COMMUNITY)
Admission: AD | Admit: 2019-12-05 | Discharge: 2019-12-07 | DRG: 806 | Disposition: A | Discharge status: Home / Self Care | Payer: 59

## 2019-12-05 ENCOUNTER — Encounter (HOSPITAL_COMMUNITY): Payer: Self-pay

## 2019-12-05 ENCOUNTER — Inpatient Hospital Stay (HOSPITAL_COMMUNITY): Admission: RE | Admit: 2019-12-05 | Payer: 59 | Source: Ambulatory Visit

## 2019-12-05 DIAGNOSIS — Z3A37 37 weeks gestation of pregnancy: Secondary | ICD-10-CM

## 2019-12-05 DIAGNOSIS — O139 Gestational [pregnancy-induced] hypertension without significant proteinuria, unspecified trimester: Secondary | ICD-10-CM | POA: Diagnosis present

## 2019-12-05 DIAGNOSIS — O134 Gestational [pregnancy-induced] hypertension without significant proteinuria, complicating childbirth: Secondary | ICD-10-CM | POA: Diagnosis present

## 2019-12-05 DIAGNOSIS — A6000 Herpesviral infection of urogenital system, unspecified: Secondary | ICD-10-CM | POA: Diagnosis present

## 2019-12-05 DIAGNOSIS — Z20822 Contact with and (suspected) exposure to covid-19: Secondary | ICD-10-CM | POA: Diagnosis present

## 2019-12-05 DIAGNOSIS — O9832 Other infections with a predominantly sexual mode of transmission complicating childbirth: Secondary | ICD-10-CM | POA: Diagnosis present

## 2019-12-05 DIAGNOSIS — Z3490 Encounter for supervision of normal pregnancy, unspecified, unspecified trimester: Secondary | ICD-10-CM | POA: Diagnosis present

## 2019-12-05 LAB — CBC
HCT: 34.9 % — ABNORMAL LOW (ref 36.0–46.0)
Hemoglobin: 12.1 g/dL (ref 12.0–15.0)
MCH: 32.4 pg (ref 26.0–34.0)
MCHC: 34.7 g/dL (ref 30.0–36.0)
MCV: 93.3 fL (ref 80.0–100.0)
Platelets: 205 10*3/uL (ref 150–400)
RBC: 3.74 MIL/uL — ABNORMAL LOW (ref 3.87–5.11)
RDW: 12.7 % (ref 11.5–15.5)
WBC: 11 10*3/uL — ABNORMAL HIGH (ref 4.0–10.5)
nRBC: 0 % (ref 0.0–0.2)

## 2019-12-05 LAB — COMPREHENSIVE METABOLIC PANEL
ALT: 17 U/L (ref 0–44)
AST: 23 U/L (ref 15–41)
Albumin: 3 g/dL — ABNORMAL LOW (ref 3.5–5.0)
Alkaline Phosphatase: 189 U/L — ABNORMAL HIGH (ref 38–126)
Anion gap: 10 (ref 5–15)
BUN: 10 mg/dL (ref 6–20)
CO2: 19 mmol/L — ABNORMAL LOW (ref 22–32)
Calcium: 8.8 mg/dL — ABNORMAL LOW (ref 8.9–10.3)
Chloride: 105 mmol/L (ref 98–111)
Creatinine, Ser: 0.63 mg/dL (ref 0.44–1.00)
GFR calc Af Amer: >60 mL/min (ref >60)
GFR calc non Af Amer: >60 mL/min (ref >60)
Glucose, Bld: 96 mg/dL (ref 70–99)
Potassium: 3.5 mmol/L (ref 3.5–5.1)
Sodium: 134 mmol/L — ABNORMAL LOW (ref 135–145)
Total Bilirubin: 0.6 mg/dL (ref 0.3–1.2)
Total Protein: 6.1 g/dL — ABNORMAL LOW (ref 6.5–8.1)

## 2019-12-05 LAB — TYPE AND SCREEN
ABO/RH(D): A POS
Antibody Screen: NEGATIVE

## 2019-12-05 LAB — PROTEIN / CREATININE RATIO, URINE
Creatinine, Urine: 21.9 mg/dL
Total Protein, Urine: <6 mg/dL

## 2019-12-05 LAB — ABO/RH: ABO/RH(D): A POS

## 2019-12-05 LAB — URIC ACID: Uric Acid, Serum: 4.5 mg/dL (ref 2.5–7.1)

## 2019-12-05 LAB — RPR: RPR Ser Ql: NONREACTIVE

## 2019-12-05 MED ORDER — ZOLPIDEM TARTRATE 5 MG PO TABS: 5.0000 mg | ORAL_TABLET | Freq: Every evening | ORAL | Status: DC | PRN

## 2019-12-05 MED ORDER — DIPHENHYDRAMINE HCL 25 MG PO CAPS: 25.0000 mg | ORAL_CAPSULE | Freq: Four times a day (QID) | ORAL | Status: DC | PRN

## 2019-12-05 MED ORDER — LACTATED RINGERS IV SOLN: 500.0000 mL | INTRAVENOUS | Status: DC | PRN

## 2019-12-05 MED ORDER — ACETAMINOPHEN 325 MG PO TABS: 650.0000 mg | ORAL_TABLET | ORAL | Status: DC | PRN

## 2019-12-05 MED ORDER — ONDANSETRON HCL 4 MG/2ML IJ SOLN: 4.0000 mg | Freq: Four times a day (QID) | INTRAMUSCULAR | Status: DC | PRN

## 2019-12-05 MED ORDER — IBUPROFEN 600 MG PO TABS
600.0000 mg | ORAL_TABLET | Freq: Four times a day (QID) | ORAL | Status: DC
  Administered 2019-12-05 – 2019-12-07 (×8): 600 mg via ORAL
  Filled 2019-12-05 (×9): qty 1

## 2019-12-05 MED ORDER — OXYTOCIN-SODIUM CHLORIDE 30-0.9 UT/500ML-% IV SOLN: 2.5000 [IU]/h | INTRAVENOUS | Status: DC

## 2019-12-05 MED ORDER — TETANUS-DIPHTH-ACELL PERTUSSIS 5-2.5-18.5 LF-MCG/0.5 IM SUSP: 0.5000 mL | Freq: Once | INTRAMUSCULAR | Status: DC

## 2019-12-05 MED ORDER — PRENATAL MULTIVITAMIN CH
1.0000 | ORAL_TABLET | Freq: Every day | ORAL | Status: DC
  Administered 2019-12-06 – 2019-12-07 (×2): 1 via ORAL
  Filled 2019-12-05 (×2): qty 1

## 2019-12-05 MED ORDER — LACTATED RINGERS IV SOLN: INTRAVENOUS | Status: DC

## 2019-12-05 MED ORDER — BISACODYL 10 MG RE SUPP: 10.0000 mg | Freq: Every day | RECTAL | Status: DC | PRN

## 2019-12-05 MED ORDER — WITCH HAZEL-GLYCERIN EX PADS: 1.0000 "application " | MEDICATED_PAD | CUTANEOUS | Status: DC | PRN

## 2019-12-05 MED ORDER — SIMETHICONE 80 MG PO CHEW: 80.0000 mg | CHEWABLE_TABLET | ORAL | Status: DC | PRN

## 2019-12-05 MED ORDER — FLEET ENEMA 7-19 GM/118ML RE ENEM: 1.0000 | ENEMA | Freq: Every day | RECTAL | Status: DC | PRN

## 2019-12-05 MED ORDER — COCONUT OIL OIL: 1.0000 "application " | TOPICAL_OIL | Status: DC | PRN

## 2019-12-05 MED ORDER — OXYTOCIN 10 UNIT/ML IJ SOLN
10.0000 [IU] | Freq: Once | INTRAMUSCULAR | Status: AC
  Administered 2019-12-05: 10 [IU] via INTRAMUSCULAR
  Filled 2019-12-05: qty 1

## 2019-12-05 MED ORDER — TERBUTALINE SULFATE 1 MG/ML IJ SOLN: 0.2500 mg | Freq: Once | INTRAMUSCULAR | Status: DC | PRN

## 2019-12-05 MED ORDER — MISOPROSTOL 50MCG HALF TABLET
50.0000 ug | ORAL_TABLET | ORAL | Status: DC | PRN
  Administered 2019-12-05 (×3): 50 ug via BUCCAL
  Filled 2019-12-05 (×3): qty 1

## 2019-12-05 MED ORDER — ONDANSETRON HCL 4 MG/2ML IJ SOLN: 4.0000 mg | INTRAMUSCULAR | Status: DC | PRN

## 2019-12-05 MED ORDER — DIBUCAINE (PERIANAL) 1 % EX OINT: 1.0000 "application " | TOPICAL_OINTMENT | CUTANEOUS | Status: DC | PRN

## 2019-12-05 MED ORDER — SENNOSIDES-DOCUSATE SODIUM 8.6-50 MG PO TABS
2.0000 | ORAL_TABLET | ORAL | Status: DC
  Administered 2019-12-05 – 2019-12-07 (×2): 2 via ORAL
  Filled 2019-12-05 (×3): qty 2

## 2019-12-05 MED ORDER — SOD CITRATE-CITRIC ACID 500-334 MG/5ML PO SOLN: 30.0000 mL | ORAL | Status: DC | PRN

## 2019-12-05 MED ORDER — OXYTOCIN BOLUS FROM INFUSION: 333.0000 mL | Freq: Once | INTRAVENOUS | Status: DC

## 2019-12-05 MED ORDER — ONDANSETRON HCL 4 MG PO TABS: 4.0000 mg | ORAL_TABLET | ORAL | Status: DC | PRN

## 2019-12-05 MED ORDER — BENZOCAINE-MENTHOL 20-0.5 % EX AERO
1.0000 "application " | INHALATION_SPRAY | CUTANEOUS | Status: DC | PRN
  Filled 2019-12-05: qty 56

## 2019-12-05 MED ORDER — ACETAMINOPHEN 500 MG PO TABS
1000.0000 mg | ORAL_TABLET | Freq: Four times a day (QID) | ORAL | Status: DC
  Administered 2019-12-05 – 2019-12-07 (×5): 1000 mg via ORAL
  Filled 2019-12-05 (×6): qty 2

## 2019-12-05 MED ORDER — LIDOCAINE HCL (PF) 1 % IJ SOLN
30.0000 mL | INTRAMUSCULAR | Status: AC | PRN
  Administered 2019-12-05: 30 mL via SUBCUTANEOUS
  Filled 2019-12-05: qty 30

## 2019-12-05 NOTE — Progress Notes (Signed)
Patient ID: Latasha Lynch, female   DOB: May 22, 1986, 34 y.o.   MRN: [id number]  Feeling painful ctx now, breathing with them, coping well.   Cvx 3-4/60/-1, IBOW FHT reassuring intermittent EFM Ctx palp mod, coupling  Will give water immersion trial for next hour or two and reassess for physiologic labor  Juliene Pina, MSN, CNM 12/05/2019, 12:47 PM

## 2019-12-05 NOTE — Lactation Note (Signed)
This note was copied from a baby's chart. Lactation Consultation Note  Patient Name: Latasha Lynch QMGQQ'P Date: 12/05/2019 Reason for consult: Initial assessment;Early term 37-38.6wks P3, 4 hour ETI female infant. Mom 's hx: HTN, HSV and family hx of Spina Bifida mom on high dose of Folic Acid in pregnancy .  Mom is experienced at breastfeeding, she breastfeed premature infants (32 weeks) who are 35 years old  for 26 months. Per mom, she plans to order a DEBP with her insurance and she will be home with infant.  Infant had one void since birth. Per mom, infant latched once for 15 minutes in L&D. Per mom, she made 2nd attempt infant has been sleepy mom knows it is time for infant to breastfeed again almost past 3 hours. Mom unwrapped sleepy infant, did hand expression infant was given 3 mls of colostrum by spoon and started to cue afterwards. Mom latched infant on right breast using the football hold position, bring infant towards her chest, mouth wide, infant latched without difficulty, swallows observed, infant was still breastfeeding after 15 minutes when LC left the room. Parents will continue to do STS. Mom knows to breastfeed infant according to hunger cues, 8 to 12 + times within 24 hours and not exceed 3 hours without feeding infant. Mom knows to call RN or LC if she has any questions, concerns or need assistance with latching infant at breast. Reviewed Baby & Me book's Breastfeeding Basics.  Mom made aware of O/P services, breastfeeding support groups, community resources, and our phone # for post-discharge questions.    Maternal Data Formula Feeding for Exclusion: No Has patient been taught Hand Expression?: Yes Does the patient have breastfeeding experience prior to this delivery?: Yes  Feeding Feeding Type: Breast Fed  LATCH Score Latch: Grasps breast easily, tongue down, lips flanged, rhythmical sucking.  Audible Swallowing: Spontaneous and intermittent  Type of Nipple:  Everted at rest and after stimulation  Comfort (Breast/Nipple): Soft / non-tender  Hold (Positioning): Assistance needed to correctly position infant at breast and maintain latch.  LATCH Score: 9  Interventions Interventions: Breast feeding basics reviewed;Breast compression;Assisted with latch;Adjust position;Skin to skin;Support pillows;Breast massage;Position options;Hand express;Expressed milk  Lactation Tools Discussed/Used     Consult Status Consult Status: Follow-up Date: 12/06/19 Follow-up type: In-patient    Latasha Lynch 12/05/2019, 8:19 PM

## 2019-12-05 NOTE — Progress Notes (Signed)
Patient ID: Latasha Lynch, female   DOB: December 02, 1985, 34 y.o.   MRN: [id number] Latasha Lynch is a 34 y.o. [id number] at [redacted]w[redacted]d by LMP admitted for IOL / gest HTN   Cervical ripening overnight with cytotec buccal x 2 doses  Subjective:  Sitting on BB at [facility], spouse [name] present and supportive. Reports feeling ctx mild. No LOF or VB.  Denies HA/NV/RUQ pain/visual changes.   Planning repeat waterbirth, considering birth on bed this time, LGA baby last delivery, no shoulder dystocia.   Objective: Vitals:   12/05/19 0707 12/05/19 0730 12/05/19 0835 12/05/19 0858  BP: (!) 140/92  (!) 143/105 (!) 146/97  Pulse: 71  90 83  Resp: 16 18  18   Temp:      TempSrc:      Weight:      Height:          FHT:  FHR: 130 bpm, variability: moderate,  accelerations:  Present,  decelerations:  Absent UC:   irregular, every 1-4 minutes SVE:   Dilation: 2.5 Effacement (%): 70 Station: -1, -2 Exam by:: Eddie Dibbles, CNM Membrane sweep Vertex No show IBOW  Labs:   Recent Labs    12/05/19 0132  WBC 11.0*  HGB 12.1  HCT 34.9*  PLT 205   CMP     Component Value Date/Time   NA 134 (L) 12/05/2019 0132   K 3.5 12/05/2019 0132   CL 105 12/05/2019 0132   CO2 19 (L) 12/05/2019 0132   GLUCOSE 96 12/05/2019 0132   BUN 10 12/05/2019 0132   CREATININE 0.63 12/05/2019 0132   CALCIUM 8.8 (L) 12/05/2019 0132   PROT 6.1 (L) 12/05/2019 0132   ALBUMIN 3.0 (L) 12/05/2019 0132   AST 23 12/05/2019 0132   ALT 17 12/05/2019 0132   ALKPHOS 189 (H) 12/05/2019 0132   BILITOT 0.6 12/05/2019 0132   GFRNONAA >60 12/05/2019 0132   GFRAA >60 12/05/2019 0132   Negative Protein Creatinine Ratio    Negative SARS CORONAVIRUS       Assessment / Plan: [id number] 34 y.o. [redacted]w[redacted]d Induction of labor due to gestational hypertension,  progressing well on pitocin  Labor: Cervical ripening, progressing well on cytotec, will repeat dose now Preeclampsia:  no signs or symptoms of toxicity and labs stable, BP to mild  range  Continue to monitor closely Fetal Wellbeing:  Category I Pain Control:  Labor support without medications, hydrotherapy in active labor, waterbirth to be decided at the time if patient desires I/D:  GBS neg Anticipated MOD:  NSVD  Juliene Pina, CNM, MSN 12/05/2019, 9:41 AM

## 2019-12-05 NOTE — H&P (Signed)
OB ADMISSION/ HISTORY & PHYSICAL:  Admission Date: 12/05/2019 12:03 AM  Admit Diagnosis: Encounter for induction of labor [Z34.90]    Latasha Lynch is a 34 y.o. female presenting for induction of labor for gestational hypertension.    Prenatal History: [id number]   64 week twin vaginal delivery with severe PEC, then SVD at term, adoption, MAB, then current pregnancy EDC : 12/20/2019, by Last Menstrual Period  C/w 7 week ultrasound Prenatal care at [facility] since   Prenatal course complicated by: - Hx. Of PTB twins with severe pre-eclampsia: has been on BBASA 81mg  - Hx. 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 - Excessive maternal weight gain 47# - Family hx of spina bifida on high dose folic acid  Prenatal Labs: ABO, Rh:  A positive  Antibody:  negative Rubella:   Immune RPR:   NR HBsAg:   Neg HIV:   NR GBS:   Negative 1 hr Glucola : 82 Genetic Screening: normal NT scan, AFP-1 negative Ultrasound: normal XY anatomy scan, anterior placenta Growth at 36 weeks: EFW 6#12 @ 95%, AC 94%, HC 89%, AFI 20cm    Maternal Diabetes: No Genetic Screening: Normal Maternal Ultrasounds/Referrals: Normal Fetal Ultrasounds or other Referrals:  None Maternal Substance Abuse:  No Significant Maternal Medications:  None Significant Maternal Lab Results:  Group B Strep negative Other Comments:  None  Medical / Surgical History :  Past medical history:  Past Medical History:  Diagnosis Date  . Ovarian cyst   . Pregnancy induced hypertension   . Preterm labor      Past surgical history:  Past Surgical History:  Procedure Laterality Date  . DILATION AND CURETTAGE OF UTERUS    . WISDOM TOOTH EXTRACTION       Family History:  Family History  Problem Relation Age of Onset  . Hypertension Mother   . Cancer Mother        mouth  . Hypertension Father   . Cancer Maternal Grandmother        ovarian  . Cancer Paternal Grandmother         lung  . Cancer Paternal Grandfather        prostate  . Heart disease Maternal Grandfather      Social History:  reports that she has never smoked. She has never used smokeless tobacco. She reports that she does not drink alcohol and does not use drugs.   Allergies: Patient has no known allergies.   Current Medications at time of admission:  Medications Prior to Admission  Medication Sig Dispense Refill Last Dose  . acetaminophen (TYLENOL) 500 MG tablet Take 500 mg by mouth every 6 (six) hours as needed.   Past Month at Unknown time  . aspirin 81 MG chewable tablet Chew by mouth daily.   12/04/2019 at Unknown time  . Doxylamine-Pyridoxine (DICLEGIS PO) Take by mouth.   12/04/2019 at Unknown time  . Prenatal Vit-Fe Fumarate-FA (PRENATAL MULTIVITAMIN) TABS tablet Take 1 tablet by mouth daily at 12 noon.   12/04/2019 at Unknown time  . FOLIC ACID PO Take by mouth.   More than a month at Unknown time  . Ondansetron HCl (ZOFRAN PO) Take by mouth.   More than a month at Unknown time  . promethazine (PHENERGAN) 25 MG tablet Take 25 mg by mouth every 6 (six) hours as needed for nausea or vomiting.   More than a month at Unknown time  Review of Systems: ROS  Physical Exam: Vital signs and nursing notes reviewed.  ED Triage Vitals  Enc Vitals Group     BP 12/05/19 0018 137/84     Pulse Rate 12/05/19 0018 61     Resp 12/05/19 0018 16     Temp --      Temp src --      SpO2 --      Weight 12/05/19 0022 183 lb 4.8 oz (83.1 kg)     Height 12/05/19 0022 5\' 8"  (1.727 m)     Head Circumference --      Peak Flow --      Pain Score --      Pain Loc --      Pain Edu? --      Excl. in [place]? --      General: AAO x 3, NAD, pleasant  Heart: RRR Lungs:CTAB Abdomen: Gravid, NT, Leopold's EFW 7#8=8# Extremities: trace LE edema Genitalia / VE: Dilation: 1 Effacement (%): 40 Presentation: Vertex Exam by:: Abigale Dorow, CNM    FHR: 120 BPM, moderate variability, +15x15 accels, no  decels TOCO: Ctx q4 minutes/mild  Labs:   Pending T&S, CBC, RPR  No results for input(s): WBC, HGB, HCT, PLT in the last 72 hours.     Assessment:  34 y.o. [id number] at [redacted]w[redacted]d  1. Induction stage of labor 2. FHR category 1 3. GBS Negative 4. Desires hydrotherapy, land birth: water birth class completed 5. Breastfeeding 6. Placenta disposal - hospital  7. Gestational Hypertension: no evidence of PEC  Plan:  1. Admit to BS 2. Routine L&D orders 3. Analgesia/anesthesia PRN  4. Expectant management 5. Anticipate NSVB  Dr. Benjie Karvonen notified of admission / plan of care   [place], MSN 12/05/2019, 1:13 AM

## 2019-12-05 NOTE — Progress Notes (Signed)
Patient ID: Latasha Lynch, female   DOB: [birth year], 34 y.o.   MRN: [id number]  S: Laboring in tub for past couple hours, progressively stronger ctx. Desires exam and possible AROM to help move labor along.   O:  Patient Vitals for the past 24 hrs:  BP Temp Temp src Pulse Resp Height Weight  12/05/19 1446 (!) 141/87 -- -- 83 18 -- --  12/05/19 1351 139/83 -- -- 100 16 -- --  12/05/19 1141 137/90 98 F (36.7 C) Oral 67 18 -- --  12/05/19 1011 124/86 -- -- 80 16 -- --  12/05/19 0939 (!) 140/92 98.9 F (37.2 C) Oral 81 16 -- --  12/05/19 0858 (!) 146/97 -- -- 83 18 -- --  12/05/19 0835 (!) 143/105 -- -- 90 -- -- --  12/05/19 0730 -- -- -- -- 18 -- --  12/05/19 0707 (!) 140/92 -- -- 71 16 -- --  12/05/19 0600 112/68 98 F (36.7 C) Oral 74 -- -- --  12/05/19 0500 109/64 -- -- 77 16 -- --  12/05/19 0401 107/62 -- -- 85 16 -- --  12/05/19 0300 122/69 -- -- 91 17 -- --  12/05/19 0201 115/61 -- -- 81 16 -- --  12/05/19 0111 (!) 142/82 98 F (36.7 C) Oral 73 16 -- --  12/05/19 0022 -- -- -- -- -- 5\' 8"  (1.727 m) 83.1 kg  12/05/19 0018 137/84 -- -- 61 16 -- --    FHT 140 IA, no audible decels Ctx q 2-3 min, strong  SVE 6/100/-1, posterior and acynclitic SROM w/ exam, clear AF  A/P: Physiologic labor after cervical ripening w/ Cytotec x 3 doses Progressing well FHT Category 1 Gest HTN stable to mild range BP, no neural sx  Hydrotherapy for labor pain effective Encouraged H/K position to help rotation Anticipate NSVB  Juliene Pina, MSN, CNM 12/05/2019, 3:27 PM

## 2019-12-06 LAB — CBC
HCT: 32.4 % — ABNORMAL LOW (ref 36.0–46.0)
Hemoglobin: 11.4 g/dL — ABNORMAL LOW (ref 12.0–15.0)
MCH: 32.9 pg (ref 26.0–34.0)
MCHC: 35.2 g/dL (ref 30.0–36.0)
MCV: 93.4 fL (ref 80.0–100.0)
Platelets: 228 10*3/uL (ref 150–400)
RBC: 3.47 MIL/uL — ABNORMAL LOW (ref 3.87–5.11)
RDW: 12.5 % (ref 11.5–15.5)
WBC: 14.7 10*3/uL — ABNORMAL HIGH (ref 4.0–10.5)
nRBC: 0 % (ref 0.0–0.2)

## 2019-12-06 MED ORDER — NIFEDIPINE ER OSMOTIC RELEASE 30 MG PO TB24
30.0000 mg | ORAL_TABLET | Freq: Every day | ORAL | Status: DC
  Administered 2019-12-06 – 2019-12-07 (×2): 30 mg via ORAL
  Filled 2019-12-06 (×2): qty 1

## 2019-12-06 NOTE — Progress Notes (Signed)
PPD # 1 S/P NSVD/WB  Live born female  Birth Weight: 7 lb 13.6 oz (3561 g) APGAR: 7, 9  Newborn Delivery   Birth date/time: 12/05/2019 15:27:00 Delivery type: Vaginal, Spontaneous     Baby name: Inda Merlin Delivering provider: Derrell Lolling C  Episiotomy:None   Lacerations:1st degree   circumcision completed  Feeding: breast  Pain control at delivery: Local   S:  Reports feeling well. Very happy with birth experience.   Denies HA/NV/RUQ pain/visual changes.               Tolerating po/ No nausea or vomiting             Bleeding is light             Pain controlled with ibuprofen (OTC)             Up ad lib / ambulatory / voiding without difficulties   O:  A & O x 3, in no apparent distress              VS:  Vitals:   12/05/19 1745 12/05/19 1845 12/05/19 2245 12/06/19 0245  BP: (!) 153/91 136/88 (!) 141/84 137/87  Pulse: 79 85 68 69  Resp: 18 18 18 18   Temp: 97.8 F (36.6 C) 98 F (36.7 C) 98.1 F (36.7 C) 98.5 F (36.9 C)  TempSrc: Oral Oral Oral Oral  SpO2: 98% 98%    Weight:      Height:        LABS:  Recent Labs    12/05/19 0132 12/06/19 0342  WBC 11.0* 14.7*  HGB 12.1 11.4*  HCT 34.9* 32.4*  PLT 205 228    Blood type: --/--/A POS, A POS Performed at [facility], [place] [address], [place], [place] [address]  [phone])  Rubella:   immune  I&O: I/O last 3 completed shifts: In: -  Out: 300 [Blood:300]          No intake/output data recorded.  Vaccines: TDaP UTD         Flu    none   Gen: AAO x 3, NAD  Abdomen: soft, non-tender, non-distended             Fundus: firm, non-tender, U-1  Perineum: repair intact, no edema  Lochia: small  Extremities: no edema, no calf pain or tenderness    A/P: PPD # 1 34 y.o., [id number]   Principal Problem:   PP care - NSVB / water 6/18 Active Problems:   Gestational hypertension  - BP mild range, no neural sx  - start Procardia 30XL daily now, close F/U in office   SVD - waterbirth   Encounter for  induction of labor   Perineal laceration with delivery, first degree   Doing well - stable status  Routine post partum orders  Anticipate discharge tomorrow    Juliene Pina, MSN, CNM 12/06/2019, 8:47 AM

## 2019-12-06 NOTE — Lactation Note (Addendum)
This note was copied from a baby's chart. Lactation Consultation Note  Patient Name: Latasha Lynch PCHEK'B Date: 12/06/2019 Reason for consult: Follow-up assessment   Baby 53 hours old and circumcised this morning. Mother is Ex BF but would like assistance because she states he is not opening wide. Mother will call for [facility] assistance.  Mother called for assistance with breastfeeding to help with wider latch. Positioned baby in laid back position but baby is still sleepy. Spoon fed him drops that mother hand expressed and demonstrated how to do suck training. Left baby STS on mother's chest.     Maternal Data    Feeding    LATCH Score                   Interventions Interventions: Breast feeding basics reviewed  Lactation Tools Discussed/Used     Consult Status Consult Status: Follow-up Date: 12/06/19 Follow-up type: In-patient    Vivianne Master [facility] 12/06/2019, 10:20 AM

## 2019-12-07 MED ORDER — NIFEDIPINE ER 30 MG PO TB24: 30.0000 mg | ORAL_TABLET | Freq: Every day | ORAL | 0 refills | Status: AC

## 2019-12-07 MED ORDER — ACETAMINOPHEN 500 MG PO TABS: 1000.0000 mg | ORAL_TABLET | Freq: Four times a day (QID) | ORAL | 0 refills | Status: AC

## 2019-12-07 MED ORDER — IBUPROFEN 600 MG PO TABS: 600.0000 mg | ORAL_TABLET | Freq: Four times a day (QID) | ORAL | 0 refills | Status: AC

## 2019-12-07 MED ORDER — COCONUT OIL OIL: 1.0000 "application " | TOPICAL_OIL | 0 refills | Status: AC | PRN

## 2019-12-07 NOTE — Discharge Summary (Signed)
OB Discharge Summary  Patient Name: Latasha Lynch DOB: [birth year] MRN: [id number]  Date of admission: 12/05/2019 Delivering provider: Juliene Pina   Admitting diagnosis: Encounter for induction of labor [Z34.90] Gestational hypertension [O13.9] Intrauterine pregnancy: [redacted]w[redacted]d     Secondary diagnosis: Patient Active Problem List   Diagnosis Date Noted   Encounter for induction of labor 12/05/2019   Perineal laceration with delivery, first degree 12/05/2019   SVD - waterbirth 11/17/2012   PP care - NSVB / water 6/18 11/17/2012   Post-dates pregnancy 11/16/2012   Gestational hypertension 11/16/2012    Date of discharge: 12/07/2019   Discharge diagnosis: Principal Problem:   PP care - NSVB / water 6/18 Active Problems:   Gestational hypertension   SVD - waterbirth   Encounter for induction of labor   Perineal laceration with delivery, first degree                                                            Post partum procedures:None  Augmentation: AROM and Cytotec Pain control: Local  Laceration:1st degree  Episiotomy:None  Complications: None  Hospital course:  Induction of Labor With Vaginal Delivery   34 y.o. yo [id number] at [redacted]w[redacted]d was admitted to the hospital 12/05/2019 for induction of labor.  Indication for induction: Gestational hypertension.  Patient had an uncomplicated labor course as follows: Membrane Rupture Time/Date: 3:00 PM ,12/05/2019   Delivery Method:Vaginal, Spontaneous  Episiotomy: None  Lacerations:  1st degree  Details of delivery can be found in separate delivery note.  Patient had a routine postpartum course. Patient is discharged home 12/07/19.  Newborn Data: Birth date:12/05/2019  Birth time:3:27 PM  Gender:Female  Living status:Living  Apgars:7 ,9  Weight:3561 g   Circumcision: Inpatient by D. Eddie Dibbles CNM  Physical exam  Vitals:   12/06/19 1728 12/06/19 1941 12/06/19 2217 12/07/19 0531  BP: 121/78 122/76 133/85 116/86  Pulse: 62 64 60 77   Resp:  15 18 16   Temp: 98.7 F (37.1 C) 98.1 F (36.7 C) 98 F (36.7 C)   TempSrc: Oral Oral Oral Oral  SpO2: 99% 100%  100%  Weight:      Height:       General: alert, cooperative and no distress Lochia: appropriate Uterine Fundus: firm Perineum: repair intact, no edema DVT Evaluation: No evidence of DVT seen on physical exam. Labs: Lab Results  Component Value Date   WBC 14.7 (H) 12/06/2019   HGB 11.4 (L) 12/06/2019   HCT 32.4 (L) 12/06/2019   MCV 93.4 12/06/2019   PLT 228 12/06/2019   CMP Latest Ref Rng & Units 12/05/2019  Glucose 70 - 99 mg/dL 96  BUN 6 - 20 mg/dL 10  Creatinine 0.44 - 1.00 mg/dL 0.63  Sodium 135 - 145 mmol/L 134(L)  Potassium 3.5 - 5.1 mmol/L 3.5  Chloride 98 - 111 mmol/L 105  CO2 22 - 32 mmol/L 19(L)  Calcium 8.9 - 10.3 mg/dL 8.8(L)  Total Protein 6.5 - 8.1 g/dL 6.1(L)  Total Bilirubin 0.3 - 1.2 mg/dL 0.6  Alkaline Phos 38 - 126 U/L 189(H)  AST 15 - 41 U/L 23  ALT 0 - 44 U/L 17   Edinburgh Postnatal Depression Scale Screening Tool 12/06/2019  I have been able to laugh and see the funny side of things. 0  I have looked forward with enjoyment to things. 0  I have blamed myself unnecessarily when things went wrong. 0  I have been anxious or worried for no good reason. 0  I have felt scared or panicky for no good reason. 0  Things have been getting on top of me. 0  I have been so unhappy that I have had difficulty sleeping. 0  I have felt sad or miserable. 0  I have been so unhappy that I have been crying. 0  The thought of harming myself has occurred to me. 0  Edinburgh Postnatal Depression Scale Total 0   Vaccines: TDaP UTD         Flu    None  Discharge instruction:  per After Visit Summary,  [facility] OB booklet and  "Understanding Mother & Baby Care" hospital booklet  After Visit Meds:  Allergies as of 12/07/2019   No Known Allergies     Medication List    STOP taking these medications   FOLIC ACID PO     TAKE these  medications   acetaminophen 500 MG tablet Commonly known as: TYLENOL Take 2 tablets (1,000 mg total) by mouth every 6 (six) hours.   coconut oil Oil Apply 1 application topically as needed.   ibuprofen 600 MG tablet Commonly known as: ADVIL Take 1 tablet (600 mg total) by mouth every 6 (six) hours.   NIFEdipine 30 MG 24 hr tablet Commonly known as: ADALAT CC Take 1 tablet (30 mg total) by mouth daily.   prenatal multivitamin Tabs tablet Take 1 tablet by mouth daily at 12 noon.            Discharge Care Instructions  (From admission, onward)         Start     Ordered   12/07/19 0000  Discharge wound care:       Comments: Sitz baths 2 times /day with warm water x 1 week. May add herbals: 1 ounce dried comfrey leaf* 1 ounce calendula flowers 1 ounce lavender flowers  Supplies can be found online at [employer] sources at [employer], [employer]  1/2 ounce dried uva ursi leaves 1/2 ounce witch hazel blossoms (if you can find them) 1/2 ounce dried sage leaf 1/2 cup sea salt Directions: Bring 2 quarts of water to a boil. Turn off heat, and place 1 ounce (approximately 1 large handful) of the above mixed herbs (not the salt) into the pot. Steep, covered, for 30 minutes.  Strain the liquid well with a fine mesh strainer, and discard the herb material. Add 2 quarts of liquid to the tub, along with the 1/2 cup of salt. This medicinal liquid can also be made into compresses and peri-rinses.   12/07/19 0758         Diet: routine diet  Activity: Advance as tolerated. Pelvic rest for 6 weeks.   Postpartum contraception: Not Discussed  Additional Medications: Procardia 30mg  XL prescribed for postpartum hypertension. Will follow-up in the office for blood pressure check in 1 week. Reviewed preeclampsia signs and symptoms and when to call office.   Newborn Data: Live born female  Birth Weight: 7 lb 13.6 oz (3561 g) APGAR: 7, 9  Newborn Delivery   Birth  date/time: 12/05/2019 15:27:00 Delivery type: Vaginal, Spontaneous     [place] Baby Feeding: Breast Disposition:home with mother  Delivery Report:  Review the Delivery Report for details.    Follow up:  Follow-up Information    Eddie Dibbles,  Estrellita Ludwig, CNM. Schedule an appointment as soon as possible for a visit in 5 day(s).   Specialty: Obstetrics and Gynecology Why: Please make an appointment for Thursday or Friday (6/24, 6/25) for a blood pressure check.  Contact information: [place] [address] [phone]              Signed: Zettie Pho, MSN 12/07/2019, 7:59 AM

## 2019-12-07 NOTE — Lactation Note (Signed)
This note was copied from a baby's chart. Lactation Consultation Note  Patient Name: Latasha Lynch HFWYO'V Date: 12/07/2019 Reason for consult: Follow-up assessment;Mother's request;Early term 37-38.6wks;Infant weight loss  Baby is 97 hours old 10 % weight loss  LC reviewed doc flow sheets with mom.  Mom mentioned baby had been spitty due to a fast delivery and may not all be documented. Mom mentioned her milk is in and the last feeding at 7 am went well with many swallows / 10 mins.  Mom requested for LC to check to see if baby had a tongue tie. LC with gloved fingers checked and noted a small mouth, upper lip stretches well with the latch and with exam, noted a small notch midline and the labial frenulum just above it,  Small skin notch lingual short distance from the tip, and baby able to stretch his tongue over the gum and lips. LC did not see baby raise tongue above the corners of the mouth. Recessed chin noted. LC had baby suck on a gloved finger and the front of the tongue stayed down and small humping of the back portion of the tongue noted.  Baby more awake after a large wet and large transitional loose stool.  LC assisted mom to latch on the left breast / football and eased the chin down and per mom comfortable. Baby fed immediately started swallowing and gulping. Baby fed for 4 mins and released , nipple well rounded. Mom burping and baby calm and awake.  Mom plans to re- latch when baby is ready.  This is moms 3rd baby breast feeding , milk is in and LC recommended prior to every feeding  Release enough milk off so the areola is compressible like a thin sandwich so the baby will be more comfortable with her let down.  Sore nipple and engorgement prevention and tx reviewed.  LC provided a hand pump ( #24 F and #27 F, comfort gels , shells and colostrum containers.  LC recommended supplementing baby back with her EBM with spoon.  Per mom has a DEBP at home.  Mom also mentioned  Her  Midwife gave her a name of a private LC that could F/U with as O/P.  Both mom and dad receptive to teaching and [facility] answered all her questions.  Mom has the [facility] resource phone numbers for [place].  LC gave the [facility] report.    Maternal Data    Feeding Feeding Type: Breast Fed  LATCH Score Latch: Grasps breast easily, tongue down, lips flanged, rhythmical sucking.  Audible Swallowing: Spontaneous and intermittent  Type of Nipple: Everted at rest and after stimulation  Comfort (Breast/Nipple): Filling, red/small blisters or bruises, mild/mod discomfort  Hold (Positioning): Assistance needed to correctly position infant at breast and maintain latch.  LATCH Score: 8  Interventions Interventions: Breast feeding basics reviewed;Assisted with latch;Skin to skin;Breast massage;Breast compression;Adjust position;Support pillows;Position options;Shells;Comfort gels;Hand pump  Lactation Tools Discussed/Used     Consult Status Consult Status: Complete Date: 12/07/19    Myer Haff 12/07/2019, 9:57 AM

## 2022-05-31 ENCOUNTER — Ambulatory Visit: Payer: Self-pay | Admitting: Dermatology

## 2022-06-29 ENCOUNTER — Ambulatory Visit: Payer: Managed Care, Other (non HMO) | Admitting: Dermatology

## 2022-06-29 DIAGNOSIS — L814 Other melanin hyperpigmentation: Secondary | ICD-10-CM

## 2022-06-29 DIAGNOSIS — L918 Other hypertrophic disorders of the skin: Secondary | ICD-10-CM

## 2022-06-29 DIAGNOSIS — D225 Melanocytic nevi of trunk: Secondary | ICD-10-CM

## 2022-06-29 DIAGNOSIS — L818 Other specified disorders of pigmentation: Secondary | ICD-10-CM | POA: Diagnosis not present

## 2022-06-29 DIAGNOSIS — D229 Melanocytic nevi, unspecified: Secondary | ICD-10-CM

## 2022-06-29 DIAGNOSIS — L578 Other skin changes due to chronic exposure to nonionizing radiation: Secondary | ICD-10-CM

## 2022-06-29 DIAGNOSIS — Z1283 Encounter for screening for malignant neoplasm of skin: Secondary | ICD-10-CM

## 2022-06-29 DIAGNOSIS — L821 Other seborrheic keratosis: Secondary | ICD-10-CM

## 2022-06-29 DIAGNOSIS — L820 Inflamed seborrheic keratosis: Secondary | ICD-10-CM | POA: Diagnosis not present

## 2022-06-29 DIAGNOSIS — D224 Melanocytic nevi of scalp and neck: Secondary | ICD-10-CM

## 2022-06-29 NOTE — Patient Instructions (Addendum)
Seborrheic Keratosis  What causes seborrheic keratoses? Seborrheic keratoses are harmless, common skin growths that first appear during adult life.  As time goes by, more growths appear.  Some people may develop a large number of them.  Seborrheic keratoses appear on both covered and uncovered body parts.  They are not caused by sunlight.  The tendency to develop seborrheic keratoses can be inherited.  They vary in color from skin-colored to gray, brown, or even black.  They can be either smooth or have a rough, warty surface.   Seborrheic keratoses are superficial and look as if they were stuck on the skin.  Under the microscope this type of keratosis looks like layers upon layers of skin.  That is why at times the top layer may seem to fall off, but the rest of the growth remains and re-grows.    Treatment Seborrheic keratoses do not need to be treated, but can easily be removed in the office.  Seborrheic keratoses often cause symptoms when they rub on clothing or jewelry.  Lesions can be in the way of shaving.  If they become inflamed, they can cause itching, soreness, or burning.  Removal of a seborrheic keratosis can be accomplished by freezing, burning, or surgery. If any spot bleeds, scabs, or grows rapidly, please return to have it checked, as these can be an indication of a skin cancer.  Cryotherapy Aftercare  Wash gently with soap and water everyday.   Apply Vaseline and Band-Aid daily until healed.    Due to recent changes in healthcare laws, you may see results of your pathology and/or laboratory studies on MyChart before the doctors have had a chance to review them. We understand that in some cases there may be results that are confusing or concerning to you. Please understand that not all results are received at the same time and often the doctors may need to interpret multiple results in order to provide you with the best plan of care or course of treatment. Therefore, we ask that you  please give us 2 business days to thoroughly review all your results before contacting the office for clarification. Should we see a critical lab result, you will be contacted sooner.   If You Need Anything After Your Visit  If you have any questions or concerns for your doctor, please call our main line at [phone] and press option 4 to reach your doctor's medical assistant. If no one answers, please leave a voicemail as directed and we will return your call as soon as possible. Messages left after 4 pm will be answered the following business day.   You may also send us a message via MyChart. We typically respond to MyChart messages within 1-2 business days.  For prescription refills, please ask your pharmacy to contact our office. Our fax number is [phone].  If you have an urgent issue when the clinic is closed that cannot wait until the next business day, you can page your doctor at the number below.    Please note that while we do our best to be available for urgent issues outside of office hours, we are not available 24/7.   If you have an urgent issue and are unable to reach us, you may choose to seek medical care at your doctor's office, retail clinic, urgent care center, or emergency room.  If you have a medical emergency, please immediately call 911 or go to the emergency department.  Pager Numbers  - Dr. Kowalski: [phone]  -   Dr. Moye: [phone]  - Dr. Stewart: [phone]  In the event of inclement weather, please call our main line at [phone] for an update on the status of any delays or closures.  Dermatology Medication Tips: Please keep the boxes that topical medications come in in order to help keep track of the instructions about where and how to use these. Pharmacies typically print the medication instructions only on the boxes and not directly on the medication tubes.   If your medication is too expensive, please contact our office at  [phone] option 4 or send us a message through MyChart.   We are unable to tell what your co-pay for medications will be in advance as this is different depending on your insurance coverage. However, we may be able to find a substitute medication at lower cost or fill out paperwork to get insurance to cover a needed medication.   If a prior authorization is required to get your medication covered by your insurance company, please allow us 1-2 business days to complete this process.  Drug prices often vary depending on where the prescription is filled and some pharmacies may offer cheaper prices.  The website www.goodrx.com contains coupons for medications through different pharmacies. The prices here do not account for what the cost may be with help from insurance (it may be cheaper with your insurance), but the website can give you the price if you did not use any insurance.  - You can print the associated coupon and take it with your prescription to the pharmacy.  - You may also stop by our office during regular business hours and pick up a GoodRx coupon card.  - If you need your prescription sent electronically to a different pharmacy, notify our office through [facility] MyChart or by phone at [phone] option 4.     Si Usted Necesita Algo Despus de Su Visita  Tambin puede enviarnos un mensaje a travs de MyChart. Por lo general respondemos a los mensajes de MyChart en el transcurso de 1 a 2 das hbiles.  Para renovar recetas, por favor pida a su farmacia que se ponga en contacto con nuestra oficina. Nuestro nmero de fax es el [phone].  Si tiene un asunto urgente cuando la clnica est cerrada y que no puede esperar hasta el siguiente da hbil, puede llamar/localizar a su doctor(a) al nmero que aparece a continuacin.   Por favor, tenga en cuenta que aunque hacemos todo lo posible para estar disponibles para asuntos urgentes fuera del horario de oficina, no estamos  disponibles las 24 horas del da, los 7 das de la semana.   Si tiene un problema urgente y no puede comunicarse con nosotros, puede optar por buscar atencin mdica  en el consultorio de su doctor(a), en una clnica privada, en un centro de atencin urgente o en una sala de emergencias.  Si tiene una emergencia mdica, por favor llame inmediatamente al 911 o vaya a la sala de emergencias.  Nmeros de bper  - Dr. Kowalski: [phone]  - Dra. Moye: [phone]  - Dra. Stewart: [phone]  En caso de inclemencias del tiempo, por favor llame a nuestra lnea principal al [phone] para una actualizacin sobre el estado de cualquier retraso o cierre.  Consejos para la medicacin en dermatologa: Por favor, guarde las cajas en las que vienen los medicamentos de uso tpico para ayudarle a seguir las instrucciones sobre dnde y cmo usarlos. Las farmacias generalmente imprimen las instrucciones del medicamento slo en las cajas y   no directamente en los tubos del medicamento.   Si su medicamento es muy caro, por favor, pngase en contacto con nuestra oficina llamando al [phone] y presione la opcin 4 o envenos un mensaje a travs de MyChart.   No podemos decirle cul ser su copago por los medicamentos por adelantado ya que esto es diferente dependiendo de la cobertura de su seguro. Sin embargo, es posible que podamos encontrar un medicamento sustituto a menor costo o llenar un formulario para que el seguro cubra el medicamento que se considera necesario.   Si se requiere una autorizacin previa para que su compaa de seguros cubra su medicamento, por favor permtanos de 1 a 2 das hbiles para completar este proceso.  Los precios de los medicamentos varan con frecuencia dependiendo del lugar de dnde se surte la receta y alguna farmacias pueden ofrecer precios ms baratos.  El sitio web www.goodrx.com tiene cupones para medicamentos de diferentes farmacias. Los precios aqu no  tienen en cuenta lo que podra costar con la ayuda del seguro (puede ser ms barato con su seguro), pero el sitio web puede darle el precio si no utiliz ningn seguro.  - Puede imprimir el cupn correspondiente y llevarlo con su receta a la farmacia.  - Tambin puede pasar por nuestra oficina durante el horario de atencin regular y recoger una tarjeta de cupones de GoodRx.  - Si necesita que su receta se enve electrnicamente a una farmacia diferente, informe a nuestra oficina a travs de MyChart de [facility] o por telfono llamando al [phone] y presione la opcin 4.  

## 2022-06-29 NOTE — Progress Notes (Signed)
New Patient Visit  Subjective  Latasha Lynch is a 37 y.o. female who presents for the following: Total body skin exam (No hx of skin cancer, no fhx of skin cancer) and check spot (Face, has had for a while, on on left check has gotten worse over past year/R post thigh 2 yrs, no symptoms ). The patient presents for Total-Body Skin Exam (TBSE) for skin cancer screening and mole check.  The patient has spots, moles and lesions to be evaluated, some may be new or changing and the patient has concerns that these could be cancer.   The following portions of the chart were reviewed this encounter and updated as appropriate:   Tobacco  Allergies  Meds  Problems  Med Hx  Surg Hx  Fam Hx     Review of Systems:  No other skin or systemic complaints except as noted in HPI or Assessment and Plan.  Objective  Well appearing patient in no apparent distress; mood and affect are within normal limits.  A full examination was performed including scalp, head, eyes, ears, nose, lips, neck, chest, axillae, abdomen, back, buttocks, bilateral upper extremities, bilateral lower extremities, hands, feet, fingers, toes, fingernails, and toenails. All findings within normal limits unless otherwise noted below.  arms, legs Hypopigmented macules arms, legs  R post thigh x 1 Stuck on waxy paps with erythema   Assessment & Plan   Lentigines - Scattered tan macules - Due to sun exposure - Benign-appearing, observe - Recommend daily broad spectrum sunscreen SPF 30+ to sun-exposed areas, reapply every 2 hours as needed. - Call for any changes - shoulders  Seborrheic Keratoses - Stuck-on, waxy, tan-brown papules and/or plaques  - Benign-appearing - Discussed benign etiology and prognosis. - Observe - Call for any changes - face, arms  Melanocytic Nevi - Tan-brown and/or pink-flesh-colored symmetric macules and papules - Benign appearing on exam today - Observation - Call clinic for new or  changing moles - Recommend daily use of broad spectrum spf 30+ sunscreen to sun-exposed areas.  - neck, back  Actinic Damage - Chronic condition, secondary to cumulative UV/sun exposure - diffuse scaly erythematous macules with underlying dyspigmentation - Recommend daily broad spectrum sunscreen SPF 30+ to sun-exposed areas, reapply every 2 hours as needed.  - Staying in the shade or wearing long sleeves, sun glasses (UVA+UVB protection) and wide brim hats (4-inch brim around the entire circumference of the hat) are also recommended for sun protection.  - Call for new or changing lesions.  Skin cancer screening performed today.   Idiopathic guttate hypomelanosis arms, legs Typically associated with sun damage Benign, observe.    Inflamed seborrheic keratosis R post thigh x 1 Symptomatic, irritating, patient would like treated. Destruction of lesion - R post thigh x 1 Complexity: simple   Destruction method: cryotherapy   Informed consent: discussed and consent obtained   Timeout:  patient name, date of birth, surgical site, and procedure verified Lesion destroyed using liquid nitrogen: Yes   Region frozen until ice ball extended beyond lesion: Yes   Outcome: patient tolerated procedure well with no complications   Post-procedure details: wound care instructions given    Acrochordons (Skin Tags) - Fleshy, skin-colored pedunculated papules - Benign appearing.  - Observe. - If desired, they can be removed with an in office procedure that is not covered by insurance. - Please call the clinic if you notice any new or changing lesions.  - axilla  Return for 2-3 yrs TBSE.  I,  Othelia Pulling, RMA, am acting as scribe for Sarina Ser, MD . Documentation: I have reviewed the above documentation for accuracy and completeness, and I agree with the above.  Sarina Ser, MD

## 2022-07-04 ENCOUNTER — Encounter: Payer: Self-pay | Admitting: Dermatology
